# Patient Record
Sex: Male | Born: 1975 | Race: White | Hispanic: No | Marital: Married | Smoking: Former smoker
Health system: Southern US, Community
[De-identification: ages and names within clinical notes are randomized; demographics above are authoritative.]

## PROBLEM LIST (undated history)

## (undated) DIAGNOSIS — M549 Dorsalgia, unspecified: Secondary | ICD-10-CM

## (undated) DIAGNOSIS — E785 Hyperlipidemia, unspecified: Secondary | ICD-10-CM

## (undated) DIAGNOSIS — Z9889 Other specified postprocedural states: Secondary | ICD-10-CM

## (undated) DIAGNOSIS — I499 Cardiac arrhythmia, unspecified: Secondary | ICD-10-CM

## (undated) DIAGNOSIS — Z87828 Personal history of other (healed) physical injury and trauma: Secondary | ICD-10-CM

## (undated) DIAGNOSIS — J302 Other seasonal allergic rhinitis: Secondary | ICD-10-CM

## (undated) DIAGNOSIS — G8929 Other chronic pain: Secondary | ICD-10-CM

## (undated) DIAGNOSIS — I10 Essential (primary) hypertension: Secondary | ICD-10-CM

## (undated) DIAGNOSIS — Z87442 Personal history of urinary calculi: Secondary | ICD-10-CM

## (undated) DIAGNOSIS — R112 Nausea with vomiting, unspecified: Secondary | ICD-10-CM

## (undated) DIAGNOSIS — J392 Other diseases of pharynx: Secondary | ICD-10-CM

## (undated) DIAGNOSIS — K219 Gastro-esophageal reflux disease without esophagitis: Secondary | ICD-10-CM

## (undated) DIAGNOSIS — Z8619 Personal history of other infectious and parasitic diseases: Secondary | ICD-10-CM

## (undated) HISTORY — PX: STRABISMUS SURGERY: SHX218

## (undated) HISTORY — PX: ADENOIDECTOMY: SUR15

## (undated) HISTORY — PX: EYE SURGERY: SHX253

## (undated) HISTORY — DX: Hyperlipidemia, unspecified: E78.5

## (undated) HISTORY — DX: Other diseases of pharynx: J39.2

## (undated) HISTORY — DX: Gastro-esophageal reflux disease without esophagitis: K21.9

---

## 1999-03-25 ENCOUNTER — Emergency Department (HOSPITAL_COMMUNITY): Admission: EM | Admit: 1999-03-25 | Discharge: 1999-03-25 | Payer: Self-pay | Admitting: Emergency Medicine

## 2004-05-10 ENCOUNTER — Ambulatory Visit: Payer: Self-pay | Admitting: Internal Medicine

## 2004-08-10 ENCOUNTER — Emergency Department (HOSPITAL_COMMUNITY): Admission: AD | Admit: 2004-08-10 | Discharge: 2004-08-10 | Payer: Self-pay | Admitting: Family Medicine

## 2004-09-07 ENCOUNTER — Emergency Department (HOSPITAL_COMMUNITY): Admission: EM | Admit: 2004-09-07 | Discharge: 2004-09-07 | Payer: Self-pay | Admitting: Emergency Medicine

## 2004-12-28 ENCOUNTER — Ambulatory Visit: Payer: Self-pay | Admitting: Family Medicine

## 2005-12-25 ENCOUNTER — Ambulatory Visit: Payer: Self-pay | Admitting: Family Medicine

## 2006-02-02 ENCOUNTER — Ambulatory Visit: Payer: Self-pay | Admitting: Family Medicine

## 2006-04-09 ENCOUNTER — Ambulatory Visit: Payer: Self-pay | Admitting: Family Medicine

## 2007-01-01 ENCOUNTER — Ambulatory Visit: Payer: Self-pay | Admitting: Family Medicine

## 2007-01-01 DIAGNOSIS — K219 Gastro-esophageal reflux disease without esophagitis: Secondary | ICD-10-CM

## 2007-01-11 ENCOUNTER — Ambulatory Visit: Payer: Self-pay | Admitting: Family Medicine

## 2007-02-22 ENCOUNTER — Ambulatory Visit: Payer: Self-pay | Admitting: Family Medicine

## 2007-06-24 ENCOUNTER — Telehealth: Payer: Self-pay | Admitting: Family Medicine

## 2007-06-29 ENCOUNTER — Telehealth: Payer: Self-pay | Admitting: Family Medicine

## 2007-07-07 ENCOUNTER — Ambulatory Visit: Payer: Self-pay | Admitting: Family Medicine

## 2007-07-10 ENCOUNTER — Ambulatory Visit: Payer: Self-pay | Admitting: Internal Medicine

## 2007-10-11 ENCOUNTER — Ambulatory Visit: Payer: Self-pay | Admitting: Family Medicine

## 2007-10-13 ENCOUNTER — Telehealth (INDEPENDENT_AMBULATORY_CARE_PROVIDER_SITE_OTHER): Payer: Self-pay | Admitting: Internal Medicine

## 2007-10-14 ENCOUNTER — Telehealth (INDEPENDENT_AMBULATORY_CARE_PROVIDER_SITE_OTHER): Payer: Self-pay | Admitting: Internal Medicine

## 2008-01-02 ENCOUNTER — Emergency Department (HOSPITAL_COMMUNITY): Admission: EM | Admit: 2008-01-02 | Discharge: 2008-01-02 | Payer: Self-pay | Admitting: Family Medicine

## 2008-01-07 ENCOUNTER — Ambulatory Visit: Payer: Self-pay | Admitting: Family Medicine

## 2008-02-14 ENCOUNTER — Ambulatory Visit: Payer: Self-pay | Admitting: Family Medicine

## 2008-02-14 DIAGNOSIS — E785 Hyperlipidemia, unspecified: Secondary | ICD-10-CM | POA: Insufficient documentation

## 2008-02-16 LAB — CONVERTED CEMR LAB
AST: 26 units/L (ref 0–37)
Alkaline Phosphatase: 55 units/L (ref 39–117)
BUN: 6 mg/dL (ref 6–23)
Cholesterol: 211 mg/dL (ref 0–200)
Creatinine, Ser: 0.9 mg/dL (ref 0.4–1.5)
Direct LDL: 135.2 mg/dL
Glucose, Bld: 73 mg/dL (ref 70–99)
HCT: 44.8 % (ref 39.0–52.0)
Lymphocytes Relative: 32.7 % (ref 12.0–46.0)
MCHC: 35.1 g/dL (ref 30.0–36.0)
MCV: 88 fL (ref 78.0–100.0)
Monocytes Absolute: 0.4 10*3/uL (ref 0.1–1.0)
Monocytes Relative: 7.3 % (ref 3.0–12.0)
Neutro Abs: 3.2 10*3/uL (ref 1.4–7.7)
Neutrophils Relative %: 57.5 % (ref 43.0–77.0)
Platelets: 184 10*3/uL (ref 150–400)
Potassium: 4.5 meq/L (ref 3.5–5.1)
RBC: 5.09 M/uL (ref 4.22–5.81)
Sodium: 143 meq/L (ref 135–145)
TSH: 1.98 microintl units/mL (ref 0.35–5.50)
Total Bilirubin: 0.8 mg/dL (ref 0.3–1.2)
Total Protein: 6.9 g/dL (ref 6.0–8.3)
VLDL: 33 mg/dL (ref 0–40)
WBC: 5.5 10*3/uL (ref 4.5–10.5)

## 2008-03-01 ENCOUNTER — Telehealth (INDEPENDENT_AMBULATORY_CARE_PROVIDER_SITE_OTHER): Payer: Self-pay | Admitting: *Deleted

## 2008-05-30 ENCOUNTER — Ambulatory Visit: Payer: Self-pay | Admitting: Family Medicine

## 2008-05-31 LAB — CONVERTED CEMR LAB
AST: 21 units/L (ref 0–37)
Cholesterol: 210 mg/dL (ref 0–200)
HDL: 35 mg/dL — ABNORMAL LOW (ref >39.0)
Triglycerides: 76 mg/dL (ref 0–149)

## 2008-08-17 ENCOUNTER — Ambulatory Visit: Payer: Self-pay | Admitting: Family Medicine

## 2008-08-17 DIAGNOSIS — J309 Allergic rhinitis, unspecified: Secondary | ICD-10-CM | POA: Insufficient documentation

## 2008-08-17 DIAGNOSIS — G5600 Carpal tunnel syndrome, unspecified upper limb: Secondary | ICD-10-CM | POA: Insufficient documentation

## 2008-09-28 ENCOUNTER — Ambulatory Visit: Payer: Self-pay | Admitting: Family Medicine

## 2008-10-03 LAB — CONVERTED CEMR LAB
ALT: 25 units/L (ref 0–53)
AST: 20 units/L (ref 0–37)
Cholesterol: 180 mg/dL (ref 0–200)
HDL: 36.6 mg/dL — ABNORMAL LOW (ref >39.00)

## 2008-11-08 ENCOUNTER — Ambulatory Visit: Payer: Self-pay | Admitting: Family Medicine

## 2008-11-08 DIAGNOSIS — M79609 Pain in unspecified limb: Secondary | ICD-10-CM

## 2008-11-08 DIAGNOSIS — S8010XA Contusion of unspecified lower leg, initial encounter: Secondary | ICD-10-CM

## 2008-11-10 ENCOUNTER — Encounter: Payer: Self-pay | Admitting: Family Medicine

## 2009-01-04 ENCOUNTER — Ambulatory Visit: Payer: Self-pay | Admitting: Family Medicine

## 2009-01-09 LAB — CONVERTED CEMR LAB
HDL: 38.7 mg/dL — ABNORMAL LOW (ref >39.00)
LDL Cholesterol: 101 mg/dL — ABNORMAL HIGH (ref 0–99)
Triglycerides: 123 mg/dL (ref 0.0–149.0)

## 2009-05-19 ENCOUNTER — Emergency Department (HOSPITAL_COMMUNITY): Admission: EM | Admit: 2009-05-19 | Discharge: 2009-05-19 | Payer: Self-pay | Admitting: Emergency Medicine

## 2009-05-28 ENCOUNTER — Ambulatory Visit: Payer: Self-pay | Admitting: Family Medicine

## 2009-05-28 DIAGNOSIS — M542 Cervicalgia: Secondary | ICD-10-CM | POA: Insufficient documentation

## 2009-05-28 DIAGNOSIS — S069X9A Unspecified intracranial injury with loss of consciousness of unspecified duration, initial encounter: Secondary | ICD-10-CM

## 2009-05-28 DIAGNOSIS — S060X0A Concussion without loss of consciousness, initial encounter: Secondary | ICD-10-CM

## 2009-06-21 ENCOUNTER — Emergency Department (HOSPITAL_COMMUNITY): Admission: EM | Admit: 2009-06-21 | Discharge: 2009-06-21 | Payer: Self-pay | Admitting: Emergency Medicine

## 2009-06-22 ENCOUNTER — Encounter: Payer: Self-pay | Admitting: Family Medicine

## 2009-07-10 ENCOUNTER — Encounter: Payer: Self-pay | Admitting: Family Medicine

## 2009-12-28 ENCOUNTER — Emergency Department (HOSPITAL_COMMUNITY): Admission: EM | Admit: 2009-12-28 | Discharge: 2009-12-28 | Payer: Self-pay | Admitting: Emergency Medicine

## 2009-12-28 ENCOUNTER — Encounter: Payer: Self-pay | Admitting: Family Medicine

## 2010-01-18 ENCOUNTER — Ambulatory Visit: Payer: Self-pay | Admitting: Family Medicine

## 2010-01-18 ENCOUNTER — Encounter: Payer: Self-pay | Admitting: Family Medicine

## 2010-01-18 DIAGNOSIS — J189 Pneumonia, unspecified organism: Secondary | ICD-10-CM | POA: Insufficient documentation

## 2010-05-21 NOTE — Assessment & Plan Note (Signed)
Summary: F/U [place] ON 05/19/09/CLE   Vital Signs:  Patient profile:   35 year old male Weight:      215.25 pounds BMI:     31.44 Temp:     98.2 degrees F oral Pulse rate:   64 / minute Pulse rhythm:   regular BP sitting:   90 / 70  (left arm) Cuff size:   large  Vitals Entered By: Linde Gillis CMA Duncan Dull) (May 28, 2009 10:01 AM) CC: f/u [place]   History of Present Illness: 35 year old male:  f/u lac, neck strain from ER  ER notes reviewed  cuttin ga tree away from another tree and there was one that was pinned, laid the saw down and was hit in the top of the head. he did see stars for a few seconds, and had some minimal amnesia, however he had a prominent covering has had no symptoms after the incident.  not sure about glasses and hat coming off   Neck still hurting a lot. and having some discomfort from coughing. he has no limitation on his motion and having some mild posterior muscular pain and some trap pain.  additionally, he had 7 staples in his head, and he is removed today.  Staple removal  Allergies (verified): No Known Drug Allergies  Past History:  Past medical, surgical, family and social histories (including risk factors) reviewed, and no changes noted (except as noted below).  Past Medical History: Reviewed history from 08/17/2008 and no changes required. strong gag reflex gerd rhinitis hyperlipidemia carpal tunnel  Past Surgical History: Reviewed history from 02/14/2008 and no changes required. Eye surgery- strabismus Severe ankle sprain (2006) mono- 09  Family History: Reviewed history from 02/14/2008 and no changes required. Father: Parkinson's, CAD, MI x 2, DM Mother: high cholesterol - diet controlled  Siblings:   Social History: Reviewed history from 07/10/2007 and no changes required. Marital Status: Married Children: 3 Occupation: [employer] quit smoking x 3 years  Review of Systems       REVIEW OF SYSTEMS  GEN: No  systemic complaints, no fevers, chills, sweats, or other acute illnesses MSK: Detailed in the HPI GI: tolerating PO intake without difficulty Neuro: No numbness, parasthesias, or tingling associated. Otherwise the pertinent positives of the ROS are noted above.    Physical Exam  General:  Well-developed,well-nourished,in no acute distress; alert,appropriate and cooperative throughout examination Head:  incision c/d/i Ears:  no external deformities.   Nose:  no external deformity.   Lungs:  Normal respiratory effort, chest expands symmetrically. Lungs are clear to auscultation, no crackles or wheezes. Heart:  Normal rate and regular rhythm. S1 and S2 normal without gallop, murmur, click, rub or other extra sounds.   Detailed Back/Spine Exam  Cervical Exam:  Inspection-deformity:    Normal Palpation-spinal tenderness:  Normal Range of Motion:    Forward Flexion:   60 degrees    Hyperextension:   75 degrees    Right Lat. Flexion:   45 degrees    Left Lat. Flexion:   45 degrees    Right Lat. Rotation:   80 degrees    Left Lat. Rotation:   80 degrees Spurling Maneuver:    negative   Impression & Recommendations:  Problem # 1:  NECK PAIN (ICD-723.1) Assessment New 05/19/2009 doi  neck strain, c/w motrin, heat, massage, ROM  His updated medication list for this problem includes:    Tramadol Hcl 50 Mg Tabs (Tramadol hcl) .Marland Kitchen... Take one to  two tablet by mouth every six hours as needed for pain  Problem # 2:  CONCUSSION WITH NO LOSS OF CONSCIOUSNESS (ICD-850.0) Assessment: New acute simple concussion without symptoms currently  Problem # 3:  HEAD INJURY, NOS (ICD-854.00)  head laceration  removed 7 staples without difficulty, I was not the operating physician  Orders: Suture Removal by Non-Operative MD ([id number])  Complete Medication List: 1)  Multivitamins Tabs (Multiple vitamin) .... One by mouth daily 2)  Ginseng  .... Daily 3)  Omeprazole 20 Mg Cpdr (Omeprazole) .Marland Kitchen..  1 by mouth once daily in am about 30 minutes before breakfast 4)  Allegra 180 Mg Tabs (Fexofenadine hcl) .Marland Kitchen.. 1 by mouth once daily for allergies 5)  Zocor 20 Mg Tabs (Simvastatin) .Marland Kitchen.. 1 by mouth once daily 6)  Tramadol Hcl 50 Mg Tabs (Tramadol hcl) .... Take one to two tablet by mouth every six hours as needed for pain  Current Allergies (reviewed today): No known allergies

## 2010-05-21 NOTE — Letter (Signed)
Summary: [facility] [facility]  [place]   Imported By: Sherian Rein 01/24/2010 15:14:19  _____________________________________________________________________  External Attachment:    Type:   Image     Comment:   External Document

## 2010-05-21 NOTE — Letter (Signed)
Summary: [facility]   Imported By: Maryln Gottron 07/17/2009 10:10:38  _____________________________________________________________________  External Attachment:    Type:   Image     Comment:   External Document

## 2010-05-21 NOTE — Assessment & Plan Note (Signed)
Summary: F/U [place] ON 12/28/09/CLE   Vital Signs:  Patient profile:   35 year old Blevins Height:      69.5 inches Weight:      196.50 pounds BMI:     28.71 Temp:     98.9 degrees F oral Pulse rate:   68 / minute Pulse rhythm:   regular BP sitting:   100 / 70  (left arm) Cuff size:   regular  Vitals Entered By: Lewanda Rife LPN (January 18, 2010 9:23 AM) CC: follow-up visit after ER visit on 12/28/09   History of Present Illness: here for f/u of pneumonia dx by ER on 9/9  tx with zithromax and tussionex- did not keep him there  overnight   still has a lot of pressure in chest -- that is a bit worse with deep breaths some kind of drainage in throat- hoarse all day (still a non smoker)  feels decent but tired , no fever  cannot get thru a full workout  still wheezes - had an inhaler -- has run out  in ams coughs up green d/c -- better through the day    LLL infiltrate on cxr no idea how he got it   prior smoker  wt is down 19 lb from last visit he has been working on diet and exercise to loose   Allergies (verified): 1)  ! Allegra 2)  Claritin  Past History:  Past Medical History: Last updated: 08/17/2008 strong gag reflex gerd rhinitis hyperlipidemia carpal tunnel  Past Surgical History: Last updated: 02/14/2008 Eye surgery- strabismus Severe ankle sprain (2006) mono- 09  Family History: Last updated: 02/14/2008 Father: Parkinson's, CAD, MI x 2, DM Mother: high cholesterol - diet controlled  Siblings:   Social History: Last updated: 07/10/2007 Marital Status: Married Children: 3 Occupation: [employer] quit smoking x 3 years  Risk Factors: Smoking Status: quit (12/30/2006)  Review of Systems General:  Complains of fatigue and malaise; denies chills, fever, and loss of appetite. Eyes:  Denies blurring and discharge. ENT:  Complains of hoarseness, nasal congestion, and postnasal drainage; denies sinus pressure. CV:  Complains of chest pain or  discomfort; denies palpitations, shortness of breath with exertion, and swelling of feet. Resp:  Complains of cough, pleuritic, and sputum productive. GI:  Denies diarrhea, nausea, and vomiting. MS:  Denies joint pain. Derm:  Denies itching and rash. Heme:  Denies abnormal bruising, bleeding, and enlarge lymph nodes.  Physical Exam  General:  overall well appearing - wt loss noted  Head:  normocephalic, atraumatic, and no abnormalities observed.  no sinus tenderness Eyes:  vision grossly intact, pupils equal, pupils round, pupils reactive to light, and no injection.   Nose:  no nasal discharge.   Mouth:  pharynx pink and moist.   Neck:  No deformities, masses, or tenderness noted. Lungs:  CTA with harsh bs at bases  no rhonchi or rales  no wheeze or prolonged exp phase is not sob Heart:  Normal rate and regular rhythm. S1 and S2 normal without gallop, murmur, click, rub or other extra sounds. Abdomen:  soft and non-tender.   Extremities:  No clubbing, cyanosis, edema, or deformity noted with normal full range of motion of all joints.   Neurologic:  gait normal and DTRs symmetrical and normal.   Skin:  Intact without suspicious lesions or rashes Cervical Nodes:  No lymphadenopathy noted Psych:  normal affect, talkative and pleasant    Impression & Recommendations:  Problem # 1:  PNEUMONIA,  LEFT (ICD-486) Assessment New dx and tx with zpack on 9/9 and still having chest symptoms / pleuritic  tx with levaquin and cxr today did review ER records recommend sympt care- see pt instructions   update if worse or sob renewed albuterol mdi  will need to f/u after resolution for pneumovax His updated medication list for this problem includes:    Levaquin 500 Mg Tabs (Levofloxacin) .Marland Kitchen... 1 by mouth once daily for 10 days  Orders: T-2 View CXR ([id number]) Prescription Created Electronically ([phone])  Complete Medication List: 1)  Multivitamins Tabs (Multiple vitamin) .... One by mouth  daily 2)  Ginseng  .... Daily 3)  Omeprazole 20 Mg Cpdr (Omeprazole) .Marland Kitchen.. 1 by mouth once daily in am about 30 minutes before breakfast 4)  Allegra 180 Mg Tabs (Fexofenadine hcl) .Marland Kitchen.. 1 by mouth once daily for allergies 5)  Zocor 20 Mg Tabs (Simvastatin) .Marland Kitchen.. 1 by mouth once daily 6)  Tramadol Hcl 50 Mg Tabs (Tramadol hcl) .... Take one to two tablet by mouth every six hours as needed for pain 7)  Levaquin 500 Mg Tabs (Levofloxacin) .Marland Kitchen.. 1 by mouth once daily for 10 days 8)  Proair Hfa 108 (Billy Base) Mcg/act Aers (Albuterol sulfate) .... 2 puffs up to every 4 hours as needed wheeze  Patient Instructions: 1)  try zyrtec 10 mg daily for allergies since allegra does not work  2)  chest x ray today  3)  you can try mucinex over the counter twice daily as directed and nasal saline spray for congestion 4)  tylenol over the counter as directed may help with aches, headache and fever 5)  call if symptoms worsen or if not improved in 4-5 days  6)  take the levaquin as directed  7)  use inhaler as need  Prescriptions: PROAIR HFA 108 (Billy BASE) MCG/ACT AERS (ALBUTEROL SULFATE) 2 puffs up to every 4 hours as needed wheeze  #1 mdi x 1   Entered and Authorized by:   Judith Part MD   Signed by:   Judith Part MD on 01/18/2010   Method used:   Electronically to        [id number]       [address]       [place], [place]  [address]       Ph: [phone]       Fax: [phone]   RxID:   [id number] LEVAQUIN 500 MG TABS (LEVOFLOXACIN) 1 by mouth once daily for 10 days  #10 x 0   Entered and Authorized by:   Judith Part MD   Signed by:   Judith Part MD on 01/18/2010   Method used:   Electronically to        [facility] [address] [id number]       [address]       [place], [place]  [address]       Ph: [phone]       Fax: [phone]   RxID:   [id number]   Current Allergies (reviewed today): !  ALLEGRA CLARITIN

## 2010-05-21 NOTE — Miscellaneous (Signed)
Summary: [facility]   Imported By: Lester McKinney 01/28/2010 10:55:52  _____________________________________________________________________  External Attachment:    Type:   Image     Comment:   External Document

## 2010-05-21 NOTE — Letter (Signed)
Summary: [facility]  [facility]   Imported By: Lanelle Bal 06/29/2009 13:43:36  _____________________________________________________________________  External Attachment:    Type:   Image     Comment:   External Document

## 2010-07-14 LAB — URINALYSIS, ROUTINE W REFLEX MICROSCOPIC
Bilirubin Urine: NEGATIVE
Ketones, ur: 15 mg/dL — AB
Nitrite: NEGATIVE
Protein, ur: 30 mg/dL — AB
Urobilinogen, UA: 1 mg/dL (ref 0.0–1.0)
pH: 6 (ref 5.0–8.0)

## 2010-07-14 LAB — URINE MICROSCOPIC-ADD ON

## 2010-09-09 ENCOUNTER — Ambulatory Visit (HOSPITAL_BASED_OUTPATIENT_CLINIC_OR_DEPARTMENT_OTHER)
Admission: RE | Admit: 2010-09-09 | Discharge: 2010-09-09 | Disposition: A | Discharge status: Home / Self Care | Payer: BC Managed Care – PPO | Source: Ambulatory Visit | Attending: Otolaryngology | Admitting: Otolaryngology

## 2010-09-09 ENCOUNTER — Other Ambulatory Visit: Payer: Self-pay | Admitting: Otolaryngology

## 2010-09-09 DIAGNOSIS — Z01812 Encounter for preprocedural laboratory examination: Secondary | ICD-10-CM | POA: Insufficient documentation

## 2010-09-09 DIAGNOSIS — J3501 Chronic tonsillitis: Secondary | ICD-10-CM | POA: Insufficient documentation

## 2010-09-09 HISTORY — PX: TONSILLECTOMY: SUR1361

## 2010-09-10 LAB — POCT HEMOGLOBIN-HEMACUE: Hemoglobin: 16.1 g/dL (ref 13.0–17.0)

## 2010-09-18 NOTE — Op Note (Signed)
  NAMEHANAD, LEINO               ACCOUNT NO.:  [id number]  MEDICAL RECORD NO.:  [id number]            PATIENT TYPE:  LOCATION:                                 FACILITY:  PHYSICIAN:  Ameena Vesey H. Pollyann Kennedy, MD          DATE OF BIRTH:  DATE OF PROCEDURE:  09/09/2010 DATE OF DISCHARGE:                              OPERATIVE REPORT   PREOPERATIVE DIAGNOSIS:  Chronic tonsillitis.  POSTOPERATIVE DIAGNOSIS:  Chronic tonsillitis.  PROCEDURE:  Tonsillectomy.  SURGEON:  Megen Madewell H. Pollyann Kennedy, MD  ANESTHESIA:  General endotracheal anesthesia was used.  COMPLICATIONS:  None.  BLOOD LOSS:  50 mL.  FINDINGS:  Moderate-sized tonsils with deep cryptic spaces and large amounts of tonsil lithiasis bilaterally.  REFERRING PHYSICIAN:  Jeanice Lim, MD  HISTORY:  A 35 year old with history of chronic and recurring tonsillar pharyngitis.  The risks, benefits, alternatives, and complications of the procedure were explained to the patient who understand and agreed to surgery.  PROCEDURE:  The patient was taken to the operating room, placed on the operating table in supine position.  Following induction of general endotracheal anesthesia the table was turned and the patient was draped in a standard fashion.  Crowe-Davis mouth gag was inserted into the oral cavity used to retract the tongue and mandible attached to the Mayo stand.  Red rubber catheter was inserted through right side of nose, withdrawn through the mouth and used to retract the soft palate and uvula.  Tonsillectomy was performed using electrocautery dissection carefully dissecting avascular plane between the capsule and constrictor muscles.  Suction cautery was used for completion of hemostasis.  The tonsils were sent together for pathologic evaluation. The pharynx was irrigated with saline and suction.  Orogastric tube was used to aspirate the contents of the stomach.  The patient was awakened, extubated and transferred to recovery in  stable condition.     Chyan Carnero H. Pollyann Kennedy, MD     JHR/MEDQ  D:  09/09/2010  T:  09/09/2010  Job:  [id number]  cc:   Jeanice Lim, M.D.  Electronically Signed by Serena Colonel MD on 09/18/2010 07:47:38 AM

## 2010-12-02 ENCOUNTER — Other Ambulatory Visit: Payer: Self-pay | Admitting: *Deleted

## 2010-12-02 MED ORDER — OMEPRAZOLE 20 MG PO TBEC: DELAYED_RELEASE_TABLET | ORAL | Status: DC

## 2011-01-22 LAB — DIFFERENTIAL
Basophils Relative: 0
Eosinophils Relative: 0
Lymphs Abs: 1.7
Monocytes Relative: 13 — ABNORMAL HIGH
Neutrophils Relative %: 70

## 2011-01-22 LAB — CBC
HCT: 40.4
Hemoglobin: 14
MCHC: 34.7
RBC: 4.7
RDW: 13.2
WBC: 10.3

## 2011-01-22 LAB — POCT INFECTIOUS MONO SCREEN: Mono Screen: POSITIVE — AB

## 2011-03-31 ENCOUNTER — Encounter: Payer: Self-pay | Admitting: Family Medicine

## 2011-04-01 ENCOUNTER — Ambulatory Visit (INDEPENDENT_AMBULATORY_CARE_PROVIDER_SITE_OTHER): Payer: BC Managed Care – PPO | Admitting: Family Medicine

## 2011-04-01 ENCOUNTER — Encounter: Payer: Self-pay | Admitting: Family Medicine

## 2011-04-01 VITALS — BP 116/74 | HR 88 | Temp 98.5°F | Ht 69.5 in | Wt 220.8 lb

## 2011-04-01 DIAGNOSIS — R309 Painful micturition, unspecified: Secondary | ICD-10-CM

## 2011-04-01 DIAGNOSIS — N451 Epididymitis: Secondary | ICD-10-CM

## 2011-04-01 DIAGNOSIS — N453 Epididymo-orchitis: Secondary | ICD-10-CM

## 2011-04-01 DIAGNOSIS — R300 Dysuria: Secondary | ICD-10-CM

## 2011-04-01 LAB — POCT UA - MICROSCOPIC ONLY
Bacteria, U Microscopic: 0
Casts, Ur, LPF, POC: 0
RBC, urine, microscopic: 0

## 2011-04-01 LAB — POCT URINALYSIS DIPSTICK
Bilirubin, UA: NEGATIVE
Glucose, UA: NEGATIVE
Ketones, UA: NEGATIVE
Leukocytes, UA: NEGATIVE
Nitrite, UA: NEGATIVE
Protein, UA: NEGATIVE
Spec Grav, UA: 1.015
Urobilinogen, UA: 0.2

## 2011-04-01 MED ORDER — CIPROFLOXACIN HCL 250 MG PO TABS: 250.0000 mg | ORAL_TABLET | Freq: Two times a day (BID) | ORAL | Status: AC

## 2011-04-01 MED ORDER — SIMVASTATIN 20 MG PO TABS: 20.0000 mg | ORAL_TABLET | Freq: Every day | ORAL | Status: DC

## 2011-04-01 NOTE — Progress Notes (Signed)
Subjective:    Patient ID: Billy Blevins, male    DOB: 1975/09/15, 35 y.o.   MRN: [id number]  HPI Here for mass on L testicle and also   Had a little dysuria this am  Drinks a lot of fluids- not dehydrated  No fever  Feels just a little off today  No std exposure suspect at all No discharge   Above his L testicle has a soft protuberant area - a little tender Has noticed for past month or so  Also L testicle is higher than it used to be  Also just a little tender  No pain on staining or bulge   Never had problems as a child No skin changes   ua is negative today   Patient Active Problem List  Diagnoses  . HYPERLIPIDEMIA  . CARPAL TUNNEL SYNDROME  . RHINITIS  . PNEUMONIA, LEFT  . G E R D  . NECK PAIN  . LEG PAIN, RIGHT  . CONCUSSION WITH NO LOSS OF CONSCIOUSNESS  . HEAD INJURY, NOS  . CONTUSION, LOWER LEG, RIGHT  . Epididymitis   Past Medical History  Diagnosis Date  . Hyperactive gag reflex   . GERD (gastroesophageal reflux disease)   . Allergy     rhinitis  . Hyperlipidemia   . Mononucleosis 2009  . Severe ankle sprain 2006   Past Surgical History  Procedure Date  . Eye surgery     strabismus   History  Substance Use Topics  . Smoking status: Former Smoker    Quit date: 04/22/2007  . Smokeless tobacco: Not on file  . Alcohol Use:    Family History  Problem Relation Age of Onset  . Hyperlipidemia Mother     diet controlled  . Heart disease Father     CAD/ MI x 2  . Diabetes Father   . Parkinsonism Father    Allergies  Allergen Reactions  . Fexofenadine     REACTION: not effective  . Loratadine     REACTION: not effective   Current Outpatient Prescriptions on File Prior to Visit  Medication Sig Dispense Refill  . fexofenadine (ALLEGRA) 180 MG tablet Take 180 mg by mouth daily.        . Multiple Vitamin (MULTIVITAMIN) tablet Take 1 tablet by mouth daily.        . Omeprazole 20 MG TBEC Take one by mouth once daily in the morning,about 30  minutes before breakfast.  90 each  0  . albuterol (PROAIR HFA) 108 (90 BASE) MCG/ACT inhaler Inhale 2 puffs into the lungs every 4 (four) hours as needed.           Review of Systems Review of Systems  Constitutional: Negative for fever, appetite change, fatigue and unexpected weight change.  Eyes: Negative for pain and visual disturbance.  Respiratory: Negative for cough and shortness of breath.   Cardiovascular: Negative for cp or palpitations    Gastrointestinal: Negative for nausea, diarrhea and constipation.  Genitourinary: Negative for urgency and frequency. pos for one episode of dysuria this am, no hematuria  Skin: Negative for pallor or rash   Neurological: Negative for weakness, light-headedness, numbness and headaches.  Hematological: Negative for adenopathy. Does not bruise/bleed easily.  Psychiatric/Behavioral: Negative for dysphoric mood. The patient is not nervous/anxious.          Objective:   Physical Exam  Constitutional: He appears well-developed and well-nourished. No distress.  HENT:  Head: Normocephalic and atraumatic.  Mouth/Throat: Oropharynx is clear  and moist.  Eyes: Conjunctivae and EOM are normal. Pupils are equal, round, and reactive to light. No scleral icterus.  Neck: Normal range of motion. Neck supple. No JVD present. No thyromegaly present.  Abdominal: He exhibits no abdominal bruit. There is no tenderness. No hernia. Hernia confirmed negative in the ventral area, confirmed negative in the right inguinal area and confirmed negative in the left inguinal area.  Genitourinary: Penis normal. Cremasteric reflex is present. Right testis shows no swelling. Left testis shows no swelling and no tenderness. No penile erythema or penile tenderness. No discharge found.       Soft area of fullness just above L testicle that is mildly tender - consistent with epididymitis  No varicocele noted   Musculoskeletal: He exhibits no edema.  Lymphadenopathy:    He has  no cervical adenopathy.       Right: No inguinal adenopathy present.       Left: No inguinal adenopathy present.  Neurological: He is alert. He has normal reflexes.  Skin: Skin is warm and dry. No rash noted. No erythema. No pallor.  Psychiatric: He has a normal mood and affect.          Assessment & Plan:

## 2011-04-01 NOTE — Patient Instructions (Signed)
Take the cipro as directed  Warm sitz baths may help  Over the counter pain medicine is ok  If worse or other sympotms like fever- alert me  If not improved after finishing cipro- alert me

## 2011-04-01 NOTE — Assessment & Plan Note (Addendum)
With not exp to STD- disc poss of inflammatory vs infectious  Pt denies any exp to std whatsoever Disc poss etiologies of this condition  Will tx with cipro bid and also sympt care (Disc symptomatic care - see instructions on AVS )- and also handout If worse or no imp - will send for [place] of testicle inst to call asap if worse pain or any swelling

## 2011-04-16 ENCOUNTER — Telehealth: Payer: Self-pay | Admitting: Internal Medicine

## 2011-04-16 DIAGNOSIS — N5089 Other specified disorders of the male genital organs: Secondary | ICD-10-CM

## 2011-04-16 NOTE — Telephone Encounter (Signed)
Patient would like to be setup for a Ultrasound he was here for an appointment on 12.11.12 for painful urination and left testicular mass.  He stated the painful urination has gone away but the mass is still there.  Please advise.

## 2011-04-16 NOTE — Telephone Encounter (Signed)
Order done

## 2011-04-17 ENCOUNTER — Other Ambulatory Visit: Payer: BC Managed Care – PPO

## 2011-04-23 ENCOUNTER — Ambulatory Visit
Admission: RE | Admit: 2011-04-23 | Discharge: 2011-04-23 | Disposition: A | Discharge status: Home / Self Care | Payer: BC Managed Care – PPO | Source: Ambulatory Visit | Attending: Family Medicine | Admitting: Family Medicine

## 2011-04-23 ENCOUNTER — Other Ambulatory Visit: Payer: Self-pay | Admitting: Family Medicine

## 2011-04-23 DIAGNOSIS — N5089 Other specified disorders of the male genital organs: Secondary | ICD-10-CM

## 2011-04-25 ENCOUNTER — Telehealth: Payer: Self-pay | Admitting: Family Medicine

## 2011-04-25 DIAGNOSIS — N5089 Other specified disorders of the male genital organs: Secondary | ICD-10-CM

## 2011-04-25 NOTE — Telephone Encounter (Signed)
Will go ahead and do urol ref

## 2011-04-25 NOTE — Telephone Encounter (Signed)
Message copied by Judy Pimple on Fri Apr 25, 2011  8:47 AM ------      Message from: Patience Musca      Created: Thu Apr 24, 2011  5:45 PM       Patient notified as instructed by telephone. Pt is still having some discomfort and would like urological referral. Pt will wait to hear from pt care coordinator.

## 2011-04-26 ENCOUNTER — Other Ambulatory Visit: Payer: Self-pay | Admitting: Family Medicine

## 2011-05-22 ENCOUNTER — Telehealth: Payer: Self-pay | Admitting: Family Medicine

## 2011-05-22 NOTE — Telephone Encounter (Signed)
Will see him then 

## 2011-05-22 NOTE — Telephone Encounter (Signed)
Patient called to let you know you'll be receiving a pre-op clearance form from Dr.Brooks,[facility],for back surgery.  I explained to him you haven't seen him for a physical since 2009 and he would need to schedule an appointment.  Patient made appointment for 05/28/11 @ 3:15.

## 2011-05-28 ENCOUNTER — Encounter: Payer: Self-pay | Admitting: Family Medicine

## 2011-05-28 ENCOUNTER — Ambulatory Visit (INDEPENDENT_AMBULATORY_CARE_PROVIDER_SITE_OTHER): Payer: BC Managed Care – PPO | Admitting: Family Medicine

## 2011-05-28 VITALS — BP 110/74 | HR 96 | Temp 98.5°F | Ht 69.5 in | Wt 217.5 lb

## 2011-05-28 DIAGNOSIS — Z01818 Encounter for other preprocedural examination: Secondary | ICD-10-CM | POA: Insufficient documentation

## 2011-05-28 DIAGNOSIS — M5136 Other intervertebral disc degeneration, lumbar region: Secondary | ICD-10-CM | POA: Insufficient documentation

## 2011-05-28 DIAGNOSIS — R000 Tachycardia, unspecified: Secondary | ICD-10-CM

## 2011-05-28 DIAGNOSIS — E785 Hyperlipidemia, unspecified: Secondary | ICD-10-CM

## 2011-05-28 DIAGNOSIS — M5137 Other intervertebral disc degeneration, lumbosacral region: Secondary | ICD-10-CM

## 2011-05-28 NOTE — Progress Notes (Signed)
Subjective:    Patient ID: Billy Blevins, male    DOB: 1975/10/18, 36 y.o.   MRN: [id number]  HPI Here for preoperative exam for back surgery Planning L5- S1 disectomy of the L side at [facility] Does not have surgery date yet  Has deg disk and fragment needs to be removed Will be Dr Shon Baton   Med allergies-none  No hx of anaphalaxis   Anesthesia-no problems No nausea   Prev surgeries-- had tonsillectomy in April  Laser eye surgery in summer - went great   meds - no blood thinners     Chemistry      Component Value Date/Time   NA 143 02/14/2008 1106   K 4.5 02/14/2008 1106   CL 106 02/14/2008 1106   CO2 31 02/14/2008 1106   BUN 6 02/14/2008 1106   CREATININE 0.9 02/14/2008 1106      Component Value Date/Time   CALCIUM 9.1 02/14/2008 1106   ALKPHOS 55 02/14/2008 1106   AST 24 01/04/2009 1039   ALT 37 01/04/2009 1039   BILITOT 0.8 02/14/2008 1106      Lab Results  Component Value Date   WBC 5.5 02/14/2008   HGB 16.1 09/09/2010   HCT 44.8 02/14/2008   MCV 88.0 02/14/2008   PLT 184 02/14/2008    Lab Results  Component Value Date   CHOL 164 01/04/2009   HDL 38.70* 01/04/2009   LDLCALC 101* 01/04/2009   LDLDIRECT 156.0 05/30/2008   TRIG 123.0 01/04/2009   CHOLHDL 4 01/04/2009    Prior smoker- quit smoking 7 years ago No chronic cough or sob  No wheeze or asthma   EKG today sinus rhythm with rate of 95  Did just drink a pepsi   Has not set up labs at hospital   Patient Active Problem List  Diagnoses  . HYPERLIPIDEMIA  . CARPAL TUNNEL SYNDROME  . RHINITIS  . PNEUMONIA, LEFT  . G E R D  . NECK PAIN  . LEG PAIN, RIGHT  . CONCUSSION WITH NO LOSS OF CONSCIOUSNESS  . HEAD INJURY, NOS  . CONTUSION, LOWER LEG, RIGHT  . Epididymitis  . Lump in the testicle  . Preoperative examination  . Lumbar degenerative disc disease  . Tachycardia   Past Medical History  Diagnosis Date  . Hyperactive gag reflex   . GERD (gastroesophageal reflux disease)   .  Allergy     rhinitis  . Hyperlipidemia   . Mononucleosis 2009  . Severe ankle sprain 2006   Past Surgical History  Procedure Date  . Eye surgery     strabismus   History  Substance Use Topics  . Smoking status: Former Smoker    Quit date: 04/22/2007  . Smokeless tobacco: Not on file  . Alcohol Use:    Family History  Problem Relation Age of Onset  . Hyperlipidemia Mother     diet controlled  . Heart disease Father     CAD/ MI x 2  . Diabetes Father   . Parkinsonism Father    Allergies  Allergen Reactions  . Fexofenadine     REACTION: not effective  . Loratadine     REACTION: not effective   Current Outpatient Prescriptions on File Prior to Visit  Medication Sig Dispense Refill  . fexofenadine (ALLEGRA) 180 MG tablet Take 180 mg by mouth daily.        . Multiple Vitamin (MULTIVITAMIN) tablet Take 1 tablet by mouth daily.        Marland Kitchen  omeprazole (PRILOSEC) 20 MG capsule TAKE ONE BY MOUTH ONCE DAILY IN THE MORNING,ABOUT 30 MINUTES BEFORE BREAKFAST.  90 capsule  3  . simvastatin (ZOCOR) 20 MG tablet Take 1 tablet (20 mg total) by mouth at bedtime.  90 tablet  3  . albuterol (PROAIR HFA) 108 (90 BASE) MCG/ACT inhaler Inhale 2 puffs into the lungs every 4 (four) hours as needed.           Review of Systems Review of Systems  Constitutional: Negative for fever, appetite change, fatigue and unexpected weight change.  Eyes: Negative for pain and visual disturbance.  Respiratory: Negative for cough and shortness of breath.   Cardiovascular: Negative for cp or palpitations    Gastrointestinal: Negative for nausea, diarrhea and constipation.  Genitourinary: Negative for urgency and frequency.  Skin: Negative for pallor or rash   MSK pos for back pain , neg for joint swelling or redness Neurological: Negative for weakness, light-headedness, numbness and headaches.  Hematological: Negative for adenopathy. Does not bruise/bleed easily.  Psychiatric/Behavioral: Negative for  dysphoric mood. The patient is not nervous/anxious.          Objective:   Physical Exam  Constitutional: He appears well-developed and well-nourished. No distress.       overwt and well appearing   HENT:  Head: Normocephalic and atraumatic.  Right Ear: External ear normal.  Left Ear: External ear normal.  Nose: Nose normal.  Mouth/Throat: Oropharynx is clear and moist.  Eyes: Conjunctivae and EOM are normal. Pupils are equal, round, and reactive to light. No scleral icterus.  Neck: Normal range of motion. Neck supple. No JVD present. Carotid bruit is not present. No tracheal deviation present. No thyromegaly present.  Cardiovascular: Regular rhythm, normal heart sounds and intact distal pulses.  Exam reveals no gallop.   No murmur heard.      Mildly tachycardic   Pulmonary/Chest: Breath sounds normal. No respiratory distress. He has no wheezes. He exhibits no tenderness.  Abdominal: Soft. Bowel sounds are normal. He exhibits no distension and no mass. There is no tenderness.  Musculoskeletal: He exhibits tenderness. He exhibits no edema.       LS tenderness  Lymphadenopathy:    He has no cervical adenopathy.  Neurological: He is alert. He has normal reflexes. No cranial nerve deficit. He exhibits normal muscle tone. Coordination normal.  Skin: Skin is warm and dry. No rash noted. No erythema. No pallor.  Psychiatric: He has a normal mood and affect.          Assessment & Plan:

## 2011-05-28 NOTE — Patient Instructions (Signed)
Labs today  Your pulse is a bit fast -- and we need to keep an eye on that  Stay away from caffeine  Will update you when labs return  If everything looks ok you are cleared for surgery

## 2011-05-28 NOTE — Assessment & Plan Note (Signed)
Nl exam - except for mildly high pulse at 95 Just drank pepsi- that could be it Lab today Will likley re check this and then clear for surgery Reassuring EKG

## 2011-05-28 NOTE — Assessment & Plan Note (Signed)
Overdue for labs On zocor and diet  Lab today and update Rev low sat fat diet

## 2011-05-28 NOTE — Assessment & Plan Note (Signed)
For upcoming disectomy - pt is doing well with pain med at this time

## 2011-05-28 NOTE — Assessment & Plan Note (Signed)
This is new Suspect from caffeine- pt will refrain from that  Lab today incl cbc and gluc and tsh

## 2011-05-29 LAB — COMPREHENSIVE METABOLIC PANEL
ALT: 56 U/L — ABNORMAL HIGH (ref 0–53)
AST: 30 U/L (ref 0–37)
Alkaline Phosphatase: 44 U/L (ref 39–117)
Creatinine, Ser: 1.7 mg/dL — ABNORMAL HIGH (ref 0.4–1.5)
Total Bilirubin: 0.6 mg/dL (ref 0.3–1.2)

## 2011-05-29 LAB — LIPID PANEL
HDL: 43 mg/dL (ref >39.00)
Total CHOL/HDL Ratio: 4
Triglycerides: 216 mg/dL — ABNORMAL HIGH (ref 0.0–149.0)

## 2011-05-29 LAB — CBC WITH DIFFERENTIAL/PLATELET
Basophils Absolute: 0 10*3/uL (ref 0.0–0.1)
Eosinophils Absolute: 0.2 10*3/uL (ref 0.0–0.7)
Hemoglobin: 14.9 g/dL (ref 13.0–17.0)
Lymphocytes Relative: 39.2 % (ref 12.0–46.0)
Lymphs Abs: 2.6 10*3/uL (ref 0.7–4.0)
MCHC: 35.2 g/dL (ref 30.0–36.0)
Neutro Abs: 3.4 10*3/uL (ref 1.4–7.7)
RDW: 13 % (ref 11.5–14.6)

## 2011-05-29 LAB — LDL CHOLESTEROL, DIRECT: Direct LDL: 97 mg/dL

## 2011-06-05 ENCOUNTER — Telehealth: Payer: Self-pay | Admitting: *Deleted

## 2011-06-05 MED ORDER — TRAMADOL HCL 50 MG PO TABS: 50.0000 mg | ORAL_TABLET | Freq: Three times a day (TID) | ORAL | Status: AC | PRN

## 2011-06-05 NOTE — Telephone Encounter (Signed)
Lets try tramadol - use with caution , will sedate  Will see him soon at his follow up Please call in or elect send px

## 2011-06-05 NOTE — Telephone Encounter (Signed)
Patient notified as instructed by telephone. Medication phoned to  [facility] as instructed.  

## 2011-06-05 NOTE — Telephone Encounter (Signed)
Patient calling asking for something for pain? He states his back is worse and because of his lab results he was told to stop all pain meds? Per pt what can he take?

## 2011-06-10 ENCOUNTER — Encounter: Payer: Self-pay | Admitting: Family Medicine

## 2011-06-10 ENCOUNTER — Ambulatory Visit (INDEPENDENT_AMBULATORY_CARE_PROVIDER_SITE_OTHER): Payer: BC Managed Care – PPO | Admitting: Family Medicine

## 2011-06-10 VITALS — BP 110/68 | HR 100 | Temp 98.2°F | Ht 69.5 in | Wt 218.0 lb

## 2011-06-10 DIAGNOSIS — N289 Disorder of kidney and ureter, unspecified: Secondary | ICD-10-CM | POA: Insufficient documentation

## 2011-06-10 DIAGNOSIS — R7989 Other specified abnormal findings of blood chemistry: Secondary | ICD-10-CM

## 2011-06-10 DIAGNOSIS — R000 Tachycardia, unspecified: Secondary | ICD-10-CM

## 2011-06-10 DIAGNOSIS — R799 Abnormal finding of blood chemistry, unspecified: Secondary | ICD-10-CM

## 2011-06-10 LAB — POCT URINALYSIS DIPSTICK
Blood, UA: NEGATIVE
Glucose, UA: NEGATIVE
Nitrite, UA: NEGATIVE
Protein, UA: NEGATIVE
Urobilinogen, UA: 0.2

## 2011-06-10 LAB — RENAL FUNCTION PANEL
Albumin: 4.4 g/dL (ref 3.5–5.2)
Creatinine, Ser: 1.1 mg/dL (ref 0.4–1.5)
Glucose, Bld: 86 mg/dL (ref 70–99)
Phosphorus: 3.6 mg/dL (ref 2.3–4.6)
Potassium: 4.4 mEq/L (ref 3.5–5.1)
Sodium: 141 mEq/L (ref 135–145)

## 2011-06-10 LAB — POCT UA - MICROSCOPIC ONLY
Casts, Ur, LPF, POC: 0
Yeast, UA: 0

## 2011-06-10 LAB — HEPATIC FUNCTION PANEL
ALT: 41 U/L (ref 0–53)
AST: 24 U/L (ref 0–37)
Albumin: 4.4 g/dL (ref 3.5–5.2)
Alkaline Phosphatase: 49 U/L (ref 39–117)

## 2011-06-10 NOTE — Assessment & Plan Note (Signed)
New in past 2 mo - no etiology and rarely if ever symptomatic Pt is overwt Hx of high chol on statin  Needs cardiac clearance for back surgery asap - so ref to cardiol for eval Rev lab today Nl cbc and tsh

## 2011-06-10 NOTE — Assessment & Plan Note (Signed)
I suspect this was from use of narcotic apap med - he has stopped this and is using tramadol for pain as needed Very rare alcohol No hx of liver problems Re check today

## 2011-06-10 NOTE — Progress Notes (Signed)
Subjective:    Patient ID: Billy Blevins, male    DOB: 11/04/1975, 36 y.o.   MRN: [id number]  HPI Here for f/u of tachycardia , preop exam/ and new elevated cr and ALT  ALT is 56- could be from tylenol in pain med Stopped his pain med  Tramadol is helping   Cr 1.7- was on nsaids inst to drink more water ua neg today  He was on cipro in dec for epididimitis-- no longer has pain  Was taking nsaids - both advil and aleve , and also ibuprofen in a sinus med different times  Is no longer taking that  Is drinking a lot more water now  Is avoiding sodas now and caffeine  Has had 2 glasses of wine/ and some grape juice     Last visit tachycardic-still the case  Does not feel like heart is racing right now  occ feels palpitation / not exertion Pulse at home has been staying 89 or higher --in mid 90s usually    Wt is stable with bmi of 31   Patient Active Problem List  Diagnoses  . HYPERLIPIDEMIA  . CARPAL TUNNEL SYNDROME  . RHINITIS  . PNEUMONIA, LEFT  . G E R D  . NECK PAIN  . LEG PAIN, RIGHT  . CONCUSSION WITH NO LOSS OF CONSCIOUSNESS  . HEAD INJURY, NOS  . CONTUSION, LOWER LEG, RIGHT  . Epididymitis  . Lump in the testicle  . Preoperative examination  . Lumbar degenerative disc disease  . Tachycardia  . Renal insufficiency  . Elevated ALT measurement   Past Medical History  Diagnosis Date  . Hyperactive gag reflex   . GERD (gastroesophageal reflux disease)   . Allergy     rhinitis  . Hyperlipidemia   . Mononucleosis 2009  . Severe ankle sprain 2006   Past Surgical History  Procedure Date  . Eye surgery     strabismus   History  Substance Use Topics  . Smoking status: Former Smoker    Quit date: 04/22/2007  . Smokeless tobacco: Not on file  . Alcohol Use:    Family History  Problem Relation Age of Onset  . Hyperlipidemia Mother     diet controlled  . Heart disease Father     CAD/ MI x 2  . Diabetes Father   . Parkinsonism Father     Allergies  Allergen Reactions  . Fexofenadine     REACTION: not effective  . Loratadine     REACTION: not effective   Current Outpatient Prescriptions on File Prior to Visit  Medication Sig Dispense Refill  . albuterol (PROAIR HFA) 108 (90 BASE) MCG/ACT inhaler Inhale 2 puffs into the lungs every 4 (four) hours as needed.        . fexofenadine (ALLEGRA) 180 MG tablet Take 180 mg by mouth daily.        . Multiple Vitamin (MULTIVITAMIN) tablet Take 1 tablet by mouth daily.        Marland Kitchen omeprazole (PRILOSEC) 20 MG capsule TAKE ONE BY MOUTH ONCE DAILY IN THE MORNING,ABOUT 30 MINUTES BEFORE BREAKFAST.  90 capsule  3  . simvastatin (ZOCOR) 20 MG tablet Take 1 tablet (20 mg total) by mouth at bedtime.  90 tablet  3  . traMADol (ULTRAM) 50 MG tablet Take 1 tablet (50 mg total) by mouth every 8 (eight) hours as needed for pain.  30 tablet  0     Review of Systems Review of Systems  Constitutional:  Negative for fever, appetite change, fatigue and unexpected weight change.  Eyes: Negative for pain and visual disturbance.  Respiratory: Negative for cough and shortness of breath.   Cardiovascular: Negative for cp or palpitations    Gastrointestinal: Negative for nausea, diarrhea and constipation.  Genitourinary: Negative for urgency and frequency. neg for blood in urine  Skin: Negative for pallor or rash   MSK pos for R lower back and leg pain that is severe at times Neurological: Negative for weakness, light-headedness, numbness and headaches.  Hematological: Negative for adenopathy. Does not bruise/bleed easily.  Psychiatric/Behavioral: Negative for dysphoric mood. The patient is not nervous/anxious.          Objective:   Physical Exam  Constitutional: He appears well-developed and well-nourished. No distress.  HENT:  Head: Normocephalic and atraumatic.  Mouth/Throat: Oropharynx is clear and moist.  Eyes: Conjunctivae and EOM are normal. Pupils are equal, round, and reactive to light. No  scleral icterus.  Neck: Normal range of motion. Neck supple. No JVD present. Carotid bruit is not present. Erythema present. No thyromegaly present.  Cardiovascular: Regular rhythm, normal heart sounds and intact distal pulses.  Exam reveals no gallop.        Tachycardia mild   Pulmonary/Chest: Effort normal and breath sounds normal. No respiratory distress. He has no wheezes.  Abdominal: Soft. Bowel sounds are normal. He exhibits no distension, no abdominal bruit and no mass. There is no tenderness.       No cva tenderness No suprapubic tenderness    Musculoskeletal: He exhibits tenderness. He exhibits no edema.       LS tenderness   Lymphadenopathy:    He has no cervical adenopathy.  Neurological: He is alert. He has normal reflexes.  Skin: Skin is warm and dry. No rash noted. No erythema. No pallor.  Psychiatric: He has a normal mood and affect.          Assessment & Plan:

## 2011-06-10 NOTE — Patient Instructions (Signed)
Labs today  We will do cardiology consult referral at check out  Drink lots of water No change in medicines  Will update you when we get results

## 2011-06-10 NOTE — Assessment & Plan Note (Signed)
With last cr 1.7 Pt admits to heavy nsaid use at the time (also had cipro for epididymitis in dec) , as well as poor fluid intake Now is off nsaid/ drinking lots of water ua nl  Rev urol note dec - no more testicular pain Does however have hx of kidney stones  Pend result today- exp imp

## 2011-06-12 ENCOUNTER — Telehealth: Payer: Self-pay

## 2011-06-12 NOTE — Telephone Encounter (Signed)
Pt said needs letter for work updating them about status of pt being out of work. Pt has cardiology appt 03-.01-13. Pt said work needs something in writing from doctor rather than pt telling them what is going on with condition.Pt can be reached at [phone].

## 2011-06-13 NOTE — Telephone Encounter (Signed)
Pt said it is OK to list rapid heart rate; pt said employer needs to know what is going on.

## 2011-06-13 NOTE — Telephone Encounter (Signed)
Is it ok if I note his diagnosis (re: rapid heart rate) or would he just like me to specify "medical condition"-- ? How much he wants his employer to know  Thanks

## 2011-06-16 NOTE — Telephone Encounter (Signed)
His letter is in the IN box

## 2011-06-16 NOTE — Telephone Encounter (Signed)
Patient notified as instructed by telephone. Letter at front desk for patient to pick up as instructed.  Pt will pick up letter at front desk today.

## 2011-06-20 ENCOUNTER — Encounter: Payer: Self-pay | Admitting: Cardiovascular Disease

## 2011-06-20 ENCOUNTER — Ambulatory Visit (INDEPENDENT_AMBULATORY_CARE_PROVIDER_SITE_OTHER): Payer: BC Managed Care – PPO | Admitting: Cardiovascular Disease

## 2011-06-20 VITALS — BP 137/88 | HR 77 | Ht 70.0 in | Wt 219.0 lb

## 2011-06-20 DIAGNOSIS — Z01818 Encounter for other preprocedural examination: Secondary | ICD-10-CM

## 2011-06-20 DIAGNOSIS — R000 Tachycardia, unspecified: Secondary | ICD-10-CM

## 2011-06-20 DIAGNOSIS — E785 Hyperlipidemia, unspecified: Secondary | ICD-10-CM

## 2011-06-20 NOTE — Patient Instructions (Signed)
You are doing well. No medication changes were made.  Please call us if you have new issues that need to be addressed before your next appt.    

## 2011-06-20 NOTE — Progress Notes (Signed)
Patient ID: JAKHARI SPACE, male    DOB: 1975-10-08, 36 y.o.   MRN: [id number]  HPI Comments: Mr. Billy Blevins is a very pleasant 36 year old gentleman who is a patient of Dr. Milinda Antis, history of recent back pain over the past several months, asthma with rare use of albuterol, presenting for evaluation of tachycardia as part of his preoperative evaluation before back surgery with Dr. Shon Baton at [facility].  He reports that he is asymptomatic and does not feel SOB. 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 He is typically active though has had some limitations secondary to his back pain over the past 3 months. Pain at times is moderate. He has not had a cardiac history in the past. He does have a strong family history of coronary artery disease. He reports having a 20 pound weight gain over the past several years. He and his wife are expecting a child in 3 months. Blood pressure typically is relatively well-controlled. He has been monitoring his heart rate at home using an apple application with average heart rate in the 70s to mid-90s. Heart rate does go up with exertion and stress as well as pain.  EKG shows normal sinus rhythm with rate 77 beats per minute with no significant ST or T wave changes   Outpatient Encounter Prescriptions as of 06/20/2011  Medication Sig Dispense Refill  . albuterol (PROAIR HFA) 108 (90 BASE) MCG/ACT inhaler Inhale 2 puffs into the lungs every 4 (four) hours as needed.        . fexofenadine (ALLEGRA) 180 MG tablet Take 180 mg by mouth daily.        . Multiple Vitamin (MULTIVITAMIN) tablet Take 1 tablet by mouth daily.        Marland Kitchen omeprazole (PRILOSEC) 20 MG capsule TAKE ONE BY MOUTH ONCE DAILY IN THE MORNING,ABOUT 30 MINUTES BEFORE BREAKFAST.  90 capsule  3  . simvastatin (ZOCOR) 20 MG tablet Take 1 tablet (20 mg total) by mouth at bedtime.  90 tablet  3  . traMADol (ULTRAM) 50 MG tablet Take 50 mg by mouth as needed.          Review of Systems  Constitutional: Negative.   HENT: Negative.   Eyes: Negative.   Respiratory: Negative.   Cardiovascular: Negative.   Gastrointestinal: Negative.   Musculoskeletal: Positive for back pain.  Skin: Negative.   Neurological: Negative.   Hematological: Negative.   Psychiatric/Behavioral: Negative.   All other systems reviewed and are negative.    BP 137/88  Pulse 77  Ht 5\' 10"  (1.778 m)  Wt 219 lb (99.338 kg)  BMI 31.42 kg/m2   Physical Exam  Nursing note and vitals reviewed. Constitutional: He is oriented to person, place, and time. He appears well-developed and well-nourished.  HENT:  Head: Normocephalic.  Nose: Nose normal.  Mouth/Throat: Oropharynx is clear and moist.  Eyes: Conjunctivae are normal. Pupils are equal, round, and reactive to light.  Neck: Normal range of motion. Neck supple. No JVD present.  Cardiovascular: Normal rate, regular rhythm, S1 normal, S2 normal, normal heart sounds and intact distal pulses.  Exam reveals no gallop and no friction rub.   No murmur heard. Pulmonary/Chest: Effort normal and breath sounds normal. No respiratory distress. He has no wheezes. He has no rales. He exhibits no tenderness.  Abdominal: Soft. Bowel sounds are normal. He exhibits no distension. There is no tenderness.  Musculoskeletal: Normal range of motion. He exhibits no edema  and no tenderness.  Lymphadenopathy:    He has no cervical adenopathy.  Neurological: He is alert and oriented to person, place, and time. Coordination normal.  Skin: Skin is warm and dry. No rash noted. No erythema.  Psychiatric: He has a normal mood and affect. His behavior is normal. Judgment and thought content normal.           Assessment and Plan

## 2011-06-20 NOTE — Assessment & Plan Note (Signed)
Rate on today's visit and per his measurement at home is within a 72 mid 90 range. As he is relatively asymptomatic, would not start any medications at this time. If medications are needed in the future for hypertension, low dose beta blocker could be used. We've asked him to continue to monitor his rhythm for now. Would probably start low-dose beta blocker for heart rate consistently greater than 100. Have asked him to work on his conditioning, increasing his daily exercise as this would probably help his baseline rate.

## 2011-06-20 NOTE — Assessment & Plan Note (Signed)
He would be acceptable risk for his upcoming back surgery. No further testing is needed.

## 2011-06-20 NOTE — Assessment & Plan Note (Signed)
Cholesterol is at goal on the current lipid regimen. No changes to the medications were made. We have asked him to start exercise in an effort to lose a small amount of weight.  Weight is up 20 pounds over the past year or so.

## 2011-07-01 ENCOUNTER — Encounter (HOSPITAL_COMMUNITY): Payer: Self-pay | Admitting: Pharmacy Technician

## 2011-07-01 ENCOUNTER — Telehealth: Payer: Self-pay

## 2011-07-01 NOTE — Telephone Encounter (Signed)
Note faxed.

## 2011-07-01 NOTE — Telephone Encounter (Signed)
Ok to have back surgery Acceptable risk. No further workup needed No special precautions before during after

## 2011-07-01 NOTE — Telephone Encounter (Signed)
Billy Blevins is needing a cardiac clearance for Spine: L5-S1 Disectomy left side. His surgery has not yet been scheduled pending clearance. Please fax over any recommendations or contraindications for the patient prior to surgery, during the procedure, or in the post-operative period.  Please advise.

## 2011-07-04 ENCOUNTER — Encounter (HOSPITAL_COMMUNITY)
Admission: RE | Admit: 2011-07-04 | Discharge: 2011-07-04 | Disposition: A | Discharge status: Home / Self Care | Payer: BC Managed Care – PPO | Source: Ambulatory Visit | Attending: Anesthesiology | Admitting: Anesthesiology

## 2011-07-04 ENCOUNTER — Encounter (HOSPITAL_COMMUNITY)
Admission: RE | Admit: 2011-07-04 | Discharge: 2011-07-04 | Disposition: A | Discharge status: Home / Self Care | Payer: BC Managed Care – PPO | Source: Ambulatory Visit | Attending: Orthopedic Surgery | Admitting: Orthopedic Surgery

## 2011-07-04 ENCOUNTER — Encounter (HOSPITAL_COMMUNITY)
Admission: RE | Admit: 2011-07-04 | Discharge: 2011-07-04 | Disposition: A | Discharge status: Home / Self Care | Payer: BC Managed Care – PPO | Source: Ambulatory Visit | Attending: Physician Assistant | Admitting: Physician Assistant

## 2011-07-04 ENCOUNTER — Encounter (HOSPITAL_COMMUNITY): Payer: Self-pay

## 2011-07-04 HISTORY — DX: Other chronic pain: G89.29

## 2011-07-04 HISTORY — DX: Dorsalgia, unspecified: M54.9

## 2011-07-04 LAB — CBC
MCH: 29.8 pg (ref 26.0–34.0)
MCV: 82.1 fL (ref 78.0–100.0)
Platelets: 184 10*3/uL (ref 150–400)
RDW: 12.7 % (ref 11.5–15.5)

## 2011-07-04 LAB — BASIC METABOLIC PANEL
CO2: 26 mEq/L (ref 19–32)
Calcium: 9.6 mg/dL (ref 8.4–10.5)
Creatinine, Ser: 0.87 mg/dL (ref 0.50–1.35)
Glucose, Bld: 91 mg/dL (ref 70–99)

## 2011-07-04 NOTE — Progress Notes (Addendum)
Dr.Gollen with [facility] in [place](pt saw about 2wks ago)-report and EKG in epic Denies ever having an echo/stress test/heart cath

## 2011-07-04 NOTE — Pre-Procedure Instructions (Signed)
Billy Blevins  07/04/2011   Your procedure is scheduled on:  Wed, Mar Billy @ 0830  Report to [facility] at 0630 AM.  Call this number if you have problems the morning of surgery: [phone]   Remember:   Do not eat food:After Midnight.  May have clear liquids: up to 4 Hours before arrival.(until 2:30 am)  Clear liquids include soda, tea, black coffee, apple or grape juice, broth,water  Take these medicines the morning of surgery with A SIP OF WATER: Tramadol or Pain Pill(if needed),Prilosec,Allegra,and Albuterol(if needed)<Bring Your Inhaler With You>  Do not wear jewelry, make-up or nail polish.  Do not wear lotions, powders, or perfumes.    Do not bring valuables to the hospital.  Contacts, dentures or bridgework may not be worn into surgery.  Leave suitcase in the car. After surgery it may be brought to your room.  For patients admitted to the hospital, checkout time is 11:00 AM the day of discharge.   Patients discharged the day of surgery will not be allowed to drive home.    Special Instructions: CHG Shower Use Special Wash: 1/2 bottle night before surgery and 1/2 bottle morning of surgery.   Please read over the following fact sheets that you were given: Pain Booklet, Coughing and Deep Breathing, MRSA Information and Surgical Site Infection Prevention

## 2011-07-07 NOTE — H&P (Signed)
Billy Blevins 07/04/2011 2:27 PM [facility]: [facility] Patient #: [id number] DOB: Aug 28, 1975 Married / Language: [place] / Race: White Male   History of Present Illness([name], PA-C; 07/04/2011 3:24 PM) The patient is a 36 year old male who comes in today for a preoperative History and Physical. The patient is scheduled for a Lumbar L5-S1 disectomy left (for left leg pain) to be performed by Dr. Debria Garret D. Shon Baton, MD at [facility] on Wednesday, July 09, 2011 at 8:30AM (@830am ) . Please see the hospital record for complete dictated history and physical.    Allergies(Lori W Lamb; 07/04/2011 2:29 PM) No Known Drug Allergies. 05/08/2011   Family History(Raela Bohl J Anibal Quinby, PA-C; 07/04/2011 3:25 PM) Cerebrovascular Accident. grandmother mothers side and grandfather mothers side Diabetes Mellitus. father, grandmother mothers side and grandmother fathers side father and grandmother mothers side Heart Disease. father Heart disease in male family member before age 34 Hypertension. mother   Social History(Lauranne Beyersdorf J [facility], PA-C; 07/04/2011 3:25 PM) Tobacco use. former smoker; smoke(d) 3 or more pack(s) per day, former smoker; smoke(d) 1 1/2 pack(s) per day Alcohol use. current drinker; drinks beer; only occasionally per week Children. 1 Current work status. working full time Drug/Alcohol Rehab (Currently). no Drug/Alcohol Rehab (Previously). no Exercise. Exercises monthly; does running / walking Exercises weekly; does other Illicit drug use. no Living situation. live with spouse Marital status. married Number of flights of stairs before winded. 2-3 Pain Contract. no Tobacco / smoke exposure. no   Medication History(Lori W Lamb; 07/04/2011 2:29 PM) Simvastatin (20MG  Tablet, Oral) Active. PriLOSEC ( Oral) Specific dose unknown - Active. ZyrTEC-D Allergy & Congestion (5-120MG  Tablet ER 12HR, Oral) Active. Norco (5-325MG  Tablet, 1 (one)  Oral q 6-8 hours prn pain, Taken 05/20/2011 to 06/19/2011) Inactive. Ultram (50MG  Tablet, Oral) Active. (prn)   Past Surgical History(Christian Treadway J [facility], PA-C; 07/04/2011 3:25 PM) Tonsillectomy Left eye surgery as a child   Other Problems(Shantal Roan J Ameliah Baskins, PA-C; 07/04/2011 3:25 PM) Chronic Pain Hypercholesterolemia Kidney Stone   Review of Systems(Lezley Bedgood J [facility], PA-C; 07/04/2011 3:25 PM) General:Not Present- Chills, Fever, Night Sweats, Appetite Loss, Fatigue, Feeling sick, Weight Gain and Weight Loss. Skin:Not Present- Itching, Rash, Skin Color Changes, Ulcer, Psoriasis and Change in Hair or Nails. HEENT:Not Present- Sensitivity to light, Hearing problems, Nose Bleed and Ringing in the Ears. Neck:Not Present- Swollen Glands and Neck Mass. Respiratory:Not Present- Snoring, Chronic Cough, Bloody sputum and Dyspnea. Cardiovascular:Not Present- Shortness of Breath, Chest Pain, Swelling of Extremities, Leg Cramps and Palpitations. Gastrointestinal:Present- Heartburn. Not Present- Bloody Stool, Abdominal Pain, Vomiting, Nausea and Incontinence of Stool. Male Genitourinary:Not Present- Blood in Urine, Frequency, Incontinence and Nocturia. Musculoskeletal:Present- Back Pain. Not Present- Muscle Weakness, Muscle Pain, Joint Stiffness, Joint Swelling and Joint Pain. Neurological:Present- Tingling, Numbness and Burning. Not Present- Tremor, Headaches and Dizziness. Psychiatric:Not Present- Anxiety, Depression and Memory Loss. Endocrine:Not Present- Cold Intolerance, Heat Intolerance, Excessive hunger and Excessive Thirst. Hematology:Not Present- Abnormal Bleeding, Anemia, Blood Clots and Easy Bruising.   Vitals(Lori W Lamb; 07/04/2011 2:37 PM) 07/04/2011 2:30 PM Weight: 218 lb Height: 70 in Body Surface Area: 2.21 m Body Mass Index: 31.28 kg/m Pulse: 95 (Regular) BP: 141/78 (Sitting, Left Arm, Standard)    Physical Exam(Chardae Mulkern J Shronda Boeh, PA-C; 07/07/2011 3:09  PM) The physical exam findings are as follows:   General General Appearance- pleasant. Not in acute distress. Orientation- Oriented X3. Build & Nutrition- Well nourished and Well developed. Posture- Normal posture. Gait- Normal. Mental Status- Alert.   Integumentary Lumbar Spine- Skin examination of  the lumbar spine is without deformity, skin lesions, lacerations or abrasions.   Head and Neck Neck Global Assessment- supple. no lymphadenopathy and no nucchal rigidty.   Eye Pupil- Bilateral- Normal, Direct reaction to light normal, Equal and Regular. Motion- Bilateral- EOMI.   Chest and Lung Exam Auscultation: Breath sounds:- Clear.   Cardiovascular Auscultation:Rhythm- Regular rate and rhythm. Heart Sounds- Normal heart sounds.   Abdomen Palpation/Percussion:Palpation and Percussion of the abdomen reveal - Non Tender, No Rebound tenderness and Soft.   Peripheral Vascular Lower Extremity:Inspection- Bilateral- Inspection Normal. Palpation:Posterior tibial pulse- Bilateral- 2+. Dorsalis pedis pulse- Bilateral- 2+.   Neurologic Sensation:Lower Extremity- Bilateral- sensation is intact in the lower extremity. Reflexes:Patellar Reflex- Bilateral- 2+. Achilles Reflex- Bilateral- 2+. Babinski- Bilateral- Babinski not present. Clonus- Bilateral- clonus not present. Hoffman's Sign- Bilateral- Hoffman's sign not present. Testing:Seated Straight Leg Raise- Bilateral- Seated straight leg raise negative.   Musculoskeletal Spine/Ribs/Pelvis Lumbosacral Spine:Inspection and Palpation- Tenderness- generalized. bony and soft tissue palpation of the lumbar spine and SI joint does not recreate their typical pain. Strength and Tone: Strength:Hip Flexion- Bilateral- 5/5. Knee Extension- Bilateral- 5/5. Knee Flexion- Bilateral- 5/5. Ankle Dorsiflexion- Bilateral- 5/5. Ankle Plantarflexion- Bilateral- 5/5. Heel walk-  Bilateral- able to heel walk without difficulty. Toe Walk- Bilateral- able to walk on toes without difficulty. Heel-Toe Walk- Bilateral- able to heel-toe walk without difficulty. ROM- Flexion- mildly decreased range of motion and painful. Extension- mildly decreased range of motion and painful. Pain:- neither flexion or extension is more painful than the other. Waddell's Signs- no Waddell's signs present. Lower Extremity Range of Motion:- No true hip, knee or ankle pain with range of motion. Gait and Station:Assistive Devices- no assistive devices.   Assessment & Plan(Annalisia Ingber J [facility], PA-C; 07/04/2011 3:23 PM) Note: unfortunately conservative measures consiting of observation and activity modification have failed to alleviate his symptoms and given the ongoing natur eof his pain and the decrease in his quality of life he wishes to proceed with surgery. Risks/benefits/alternatives to surgery/expectations following the procedure have been reviewed with the patient by Dr. Shon Baton.  MRI of the lumbar spine dated 05/10/2011 demonstrates L5-S1 moderate left lateral cranial extrusion displaces the L5 dorsal root ganglion  He has been medically cleared for surgery by his PCP, Dr. Milinda Antis as well as his cardiologist Dr. Mariah Milling. Please see the scanned form in the office chart for specifics. He is scheudled to be fitted for a corsett brace on Tuesday at 12PM. He completed his pre-op hopistal requirements earlier today.   Plan, at this time, is to proceed with surgery as scheduled. All of his questions have been encouraged addressed and answered.   Signed electronically by Gwinda Maine, PA-C (07/07/2011 3:10 PM)  Billy Blevins 05/20/2011 8:46 AM Location: [facility] Patient #: [id number] DOB: March 05, 1976 Married / Language: [place] / Race: White Male   History of Present Illness(Sharon Gillian Shields; 05/20/2011 8:50 AM) The patient is a 36 year old male who presents  today for follow up of their back. The patient is being followed for their back pain. They are now 1 month(s) out from when symptoms began. The patient states that they are doing poorly. The following medication has been used for pain control: Tylenol. The patient reports their current pain level to be mild to moderate. The patient presents today following MRI (lumbar). Note for "Follow-up back": pain radiates into left leg with spasms and numbness into great toe. Difficult to sleep at night    Subjective Transcription(DAHARI Sheela Stack, MD; 05/20/2011 12:43  PM)  Sparsh returns for a followup. We are now six weeks into his back, buttock and left leg pain. The patient states his pain has started to recur, quite severe. This did improve a little bit with a Medrol dosepak but that was only temporary.        Allergies(Sharon Gillian Shields; 05/20/2011 8:50 AM) No Known Drug Allergies. 05/08/2011   Social History(Sharon J Roxan Hockey; 05/20/2011 8:50 AM) Tobacco / smoke exposure. no   Medication History(Sharon Gillian Shields; 05/20/2011 8:50 AM) PredniSONE (Pak) (10MG  Tablet, 1 Oral take as directed for 6 days, Taken starting 05/08/2011) Active. Diclofenac Sodium (75MG  Tablet, Oral) Active. Cyclobenzaprine HCl ( Oral) Specific dose unknown - Active. Simvastatin (20MG  Tablet, Oral) Active. PriLOSEC ( Oral) Specific dose unknown - Active. ZyrTEC-D Allergy & Congestion (5-120MG  Tablet ER 12HR, Oral) Active.   Vitals(Sharon Gillian Shields; 05/20/2011 8:50 AM) 05/20/2011 8:50 AM Weight: 222 lb Height: 71 in Body Surface Area: 2.25 m Body Mass Index: 30.96 kg/m    Objective Transcription(DAHARI D BROOKS, MD; 05/20/2011 12:43 PM)  At this point he still complains of severe pain going from the back into the left lateral aspect of his calf and into the left dorsum of the foot towards the big toe. This is directly in the L5 distribution.    RADIOGRAPHS:  The MRI from 05-10-11 clearly  demonstrates an extruded far lateral disk herniation at L5-S1 going to the left. This displaces the L5 dorsal root ganglion. 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      Assessment & Plan(Sharon Gillian Shields; 05/20/2011 9:22 AM) Pain, Lumbar (LBP) (724.2) Current Plans l Started Norco 5-325MG , 1 (one) Tablet q 6-8 hours prn pain, #90, 30 days starting 05/20/2011, No Refill.   Plans Transcription(DAHARI D BROOKS, MD; 05/20/2011 12:43 PM)  At this point in time the patient has a left lateral cranial extrusion displacing the left L5 dorsal root ganglion in the foramen. He has had appropriate conservative treatment consisting of a self directed exercise program, Medrol dosepak and observation. Despite this, over the last six weeks his quality of life has continued to deteriorate. As a result, he would like to pursue surgical intervention. We did discuss injection therapy and observation. The risks of surgery as I have explained them include infection, bleeding, nerve damage, death, stroke, paralysis, failure to heal, need for further surgery, ongoing or worsening pain, loss of bowel or bladder control, need for further surgery, recurrent disk herniation, bleeding, blood clots, significant increase in pain in the back, buttock and leg, need for fusion surgery in the future.    I have explained to him the routine post operative course. I have discussed the surgery in detail with him. All of his questions were encouraged and addressed. I have given him some additional reviewing material. He would like to set this up for as soon as possible. We will go ahead and do this.      Miscellaneous Transcription(DAHARI Sheela Stack, MD; 05/20/2011 12:43 PM)  Debria Garret D. Shon Baton, MD Norman Herrlich    T:  05-20-11  D: 05-20-11    Signed electronically by Alvy Beal, MD (05/20/2011 1:56 PM)

## 2011-07-08 MED ORDER — CEFAZOLIN SODIUM-DEXTROSE 2-3 GM-% IV SOLR
2.0000 g | INTRAVENOUS | Status: AC
  Administered 2011-07-09: 2 g via INTRAVENOUS
  Filled 2011-07-08: qty 50

## 2011-07-09 ENCOUNTER — Ambulatory Visit (HOSPITAL_COMMUNITY)
Admission: RE | Admit: 2011-07-09 | Discharge: 2011-07-10 | Disposition: A | Discharge status: Home / Self Care | Payer: BC Managed Care – PPO | Source: Ambulatory Visit | Attending: Orthopedic Surgery | Admitting: Orthopedic Surgery

## 2011-07-09 ENCOUNTER — Ambulatory Visit (HOSPITAL_COMMUNITY): Payer: BC Managed Care – PPO

## 2011-07-09 ENCOUNTER — Encounter (HOSPITAL_COMMUNITY)
Admission: RE | Disposition: A | Discharge status: Home / Self Care | Payer: Self-pay | Source: Ambulatory Visit | Attending: Orthopedic Surgery

## 2011-07-09 ENCOUNTER — Encounter (HOSPITAL_COMMUNITY): Payer: Self-pay

## 2011-07-09 ENCOUNTER — Encounter (HOSPITAL_COMMUNITY): Payer: Self-pay | Admitting: *Deleted

## 2011-07-09 DIAGNOSIS — K219 Gastro-esophageal reflux disease without esophagitis: Secondary | ICD-10-CM | POA: Insufficient documentation

## 2011-07-09 DIAGNOSIS — M5126 Other intervertebral disc displacement, lumbar region: Secondary | ICD-10-CM | POA: Insufficient documentation

## 2011-07-09 DIAGNOSIS — G8929 Other chronic pain: Secondary | ICD-10-CM | POA: Insufficient documentation

## 2011-07-09 DIAGNOSIS — M5136 Other intervertebral disc degeneration, lumbar region: Secondary | ICD-10-CM

## 2011-07-09 DIAGNOSIS — E7800 Pure hypercholesterolemia, unspecified: Secondary | ICD-10-CM | POA: Insufficient documentation

## 2011-07-09 DIAGNOSIS — Z01812 Encounter for preprocedural laboratory examination: Secondary | ICD-10-CM | POA: Insufficient documentation

## 2011-07-09 DIAGNOSIS — E785 Hyperlipidemia, unspecified: Secondary | ICD-10-CM | POA: Insufficient documentation

## 2011-07-09 HISTORY — PX: LUMBAR LAMINECTOMY/DECOMPRESSION MICRODISCECTOMY: SHX5026

## 2011-07-09 SURGERY — LUMBAR LAMINECTOMY/DECOMPRESSION MICRODISCECTOMY
Anesthesia: General | Site: Back | Wound class: Clean

## 2011-07-09 MED ORDER — PANTOPRAZOLE SODIUM 40 MG PO TBEC
40.0000 mg | DELAYED_RELEASE_TABLET | Freq: Every day | ORAL | Status: DC
  Administered 2011-07-10: 40 mg via ORAL
  Filled 2011-07-09: qty 1

## 2011-07-09 MED ORDER — FENTANYL CITRATE 0.05 MG/ML IJ SOLN
INTRAMUSCULAR | Status: AC
  Filled 2011-07-09: qty 2

## 2011-07-09 MED ORDER — ONDANSETRON HCL 4 MG/2ML IJ SOLN: 4.0000 mg | INTRAMUSCULAR | Status: DC | PRN

## 2011-07-09 MED ORDER — FENTANYL CITRATE 0.05 MG/ML IJ SOLN
25.0000 ug | INTRAMUSCULAR | Status: DC | PRN
  Administered 2011-07-09 (×2): 50 ug via INTRAVENOUS

## 2011-07-09 MED ORDER — MIDAZOLAM HCL 2 MG/2ML IJ SOLN: 0.5000 mg | Freq: Once | INTRAMUSCULAR | Status: DC | PRN

## 2011-07-09 MED ORDER — ACETAMINOPHEN 10 MG/ML IV SOLN
INTRAVENOUS | Status: AC
  Filled 2011-07-09: qty 100

## 2011-07-09 MED ORDER — PHENOL 1.4 % MT LIQD: 1.0000 | OROMUCOSAL | Status: DC | PRN

## 2011-07-09 MED ORDER — LACTATED RINGERS IV SOLN
INTRAVENOUS | Status: DC
  Administered 2011-07-09: 22:00:00 via INTRAVENOUS

## 2011-07-09 MED ORDER — SODIUM CHLORIDE 0.9 % IJ SOLN
3.0000 mL | Freq: Two times a day (BID) | INTRAMUSCULAR | Status: DC
  Administered 2011-07-09: 3 mL via INTRAVENOUS

## 2011-07-09 MED ORDER — OXYCODONE HCL 5 MG PO TABS
10.0000 mg | ORAL_TABLET | ORAL | Status: DC | PRN
  Administered 2011-07-09 – 2011-07-10 (×3): 10 mg via ORAL
  Filled 2011-07-09 (×3): qty 2

## 2011-07-09 MED ORDER — SODIUM CHLORIDE 0.9 % IJ SOLN: 3.0000 mL | INTRAMUSCULAR | Status: DC | PRN

## 2011-07-09 MED ORDER — PANTOPRAZOLE SODIUM 40 MG IV SOLR: 40.0000 mg | Freq: Every day | INTRAVENOUS | Status: DC

## 2011-07-09 MED ORDER — NEOSTIGMINE METHYLSULFATE 1 MG/ML IJ SOLN
INTRAMUSCULAR | Status: DC | PRN
  Administered 2011-07-09: 3 mg via INTRAVENOUS

## 2011-07-09 MED ORDER — SODIUM CHLORIDE 0.9 % IV SOLN: 250.0000 mL | INTRAVENOUS | Status: DC

## 2011-07-09 MED ORDER — LIDOCAINE HCL (CARDIAC) 20 MG/ML IV SOLN
INTRAVENOUS | Status: DC | PRN
  Administered 2011-07-09: 25 mg via INTRAVENOUS

## 2011-07-09 MED ORDER — GLYCOPYRROLATE 0.2 MG/ML IJ SOLN
INTRAMUSCULAR | Status: DC | PRN
  Administered 2011-07-09: .4 mg via INTRAVENOUS

## 2011-07-09 MED ORDER — FENTANYL CITRATE 0.05 MG/ML IJ SOLN
INTRAMUSCULAR | Status: DC | PRN
  Administered 2011-07-09: 50 ug via INTRAVENOUS
  Administered 2011-07-09: 200 ug via INTRAVENOUS

## 2011-07-09 MED ORDER — LACTATED RINGERS IV SOLN: INTRAVENOUS | Status: DC

## 2011-07-09 MED ORDER — ACETAMINOPHEN 10 MG/ML IV SOLN
1000.0000 mg | Freq: Once | INTRAVENOUS | Status: AC
  Administered 2011-07-09: 1000 mg via INTRAVENOUS
  Filled 2011-07-09: qty 100

## 2011-07-09 MED ORDER — LACTATED RINGERS IV SOLN
INTRAVENOUS | Status: DC | PRN
  Administered 2011-07-09 (×2): via INTRAVENOUS

## 2011-07-09 MED ORDER — PROMETHAZINE HCL 25 MG/ML IJ SOLN: 6.2500 mg | INTRAMUSCULAR | Status: DC | PRN

## 2011-07-09 MED ORDER — ONDANSETRON HCL 4 MG/2ML IJ SOLN
INTRAMUSCULAR | Status: DC | PRN
  Administered 2011-07-09 (×2): 4 mg via INTRAVENOUS

## 2011-07-09 MED ORDER — THROMBIN 20000 UNITS EX KIT
PACK | CUTANEOUS | Status: DC | PRN
  Administered 2011-07-09: 09:00:00 via TOPICAL

## 2011-07-09 MED ORDER — 0.9 % SODIUM CHLORIDE (POUR BTL) OPTIME
TOPICAL | Status: DC | PRN
  Administered 2011-07-09: 1000 mL

## 2011-07-09 MED ORDER — ACETAMINOPHEN 325 MG PO TABS: 325.0000 mg | ORAL_TABLET | ORAL | Status: DC | PRN

## 2011-07-09 MED ORDER — DOCUSATE SODIUM 100 MG PO CAPS
100.0000 mg | ORAL_CAPSULE | Freq: Two times a day (BID) | ORAL | Status: DC
  Administered 2011-07-09: 100 mg via ORAL
  Filled 2011-07-09 (×2): qty 1

## 2011-07-09 MED ORDER — DEXAMETHASONE SODIUM PHOSPHATE 4 MG/ML IJ SOLN
4.0000 mg | Freq: Four times a day (QID) | INTRAMUSCULAR | Status: DC
  Administered 2011-07-09 – 2011-07-10 (×2): 4 mg via INTRAVENOUS
  Filled 2011-07-09 (×7): qty 1

## 2011-07-09 MED ORDER — ZOLPIDEM TARTRATE 10 MG PO TABS: 10.0000 mg | ORAL_TABLET | Freq: Every evening | ORAL | Status: DC | PRN

## 2011-07-09 MED ORDER — KETOROLAC TROMETHAMINE 30 MG/ML IJ SOLN
15.0000 mg | Freq: Once | INTRAMUSCULAR | Status: AC | PRN
  Administered 2011-07-09: 15 mg via INTRAVENOUS

## 2011-07-09 MED ORDER — METHOCARBAMOL 100 MG/ML IJ SOLN
500.0000 mg | Freq: Four times a day (QID) | INTRAVENOUS | Status: DC | PRN
  Administered 2011-07-09: 500 mg via INTRAVENOUS
  Filled 2011-07-09: qty 5

## 2011-07-09 MED ORDER — BUPIVACAINE-EPINEPHRINE 0.25% -1:200000 IJ SOLN
INTRAMUSCULAR | Status: DC | PRN
  Administered 2011-07-09: 10 mL

## 2011-07-09 MED ORDER — ROCURONIUM BROMIDE 100 MG/10ML IV SOLN
INTRAVENOUS | Status: DC | PRN
  Administered 2011-07-09: 50 mg via INTRAVENOUS

## 2011-07-09 MED ORDER — PROPOFOL 10 MG/ML IV EMUL
INTRAVENOUS | Status: DC | PRN
  Administered 2011-07-09: 200 mg via INTRAVENOUS

## 2011-07-09 MED ORDER — MIDAZOLAM HCL 5 MG/5ML IJ SOLN
INTRAMUSCULAR | Status: DC | PRN
  Administered 2011-07-09: 2 mg via INTRAVENOUS

## 2011-07-09 MED ORDER — ACETAMINOPHEN 10 MG/ML IV SOLN
1000.0000 mg | Freq: Four times a day (QID) | INTRAVENOUS | Status: AC
  Administered 2011-07-09 – 2011-07-10 (×4): 1000 mg via INTRAVENOUS
  Filled 2011-07-09 (×4): qty 100

## 2011-07-09 MED ORDER — CEFAZOLIN SODIUM 1-5 GM-% IV SOLN
1.0000 g | Freq: Three times a day (TID) | INTRAVENOUS | Status: AC
  Administered 2011-07-09 – 2011-07-10 (×2): 1 g via INTRAVENOUS
  Filled 2011-07-09 (×2): qty 50

## 2011-07-09 MED ORDER — METHOCARBAMOL 500 MG PO TABS
500.0000 mg | ORAL_TABLET | Freq: Four times a day (QID) | ORAL | Status: DC | PRN
  Filled 2011-07-09: qty 1

## 2011-07-09 MED ORDER — DEXAMETHASONE 4 MG PO TABS
4.0000 mg | ORAL_TABLET | Freq: Four times a day (QID) | ORAL | Status: DC
  Administered 2011-07-10 (×2): 4 mg via ORAL
  Filled 2011-07-09 (×7): qty 1

## 2011-07-09 MED ORDER — MORPHINE SULFATE 4 MG/ML IJ SOLN: 1.0000 mg | INTRAMUSCULAR | Status: DC | PRN

## 2011-07-09 MED ORDER — DEXAMETHASONE SODIUM PHOSPHATE 10 MG/ML IJ SOLN
10.0000 mg | Freq: Once | INTRAMUSCULAR | Status: AC
  Administered 2011-07-09: 10 mg via INTRAVENOUS
  Filled 2011-07-09: qty 1

## 2011-07-09 MED ORDER — ALBUTEROL SULFATE HFA 108 (90 BASE) MCG/ACT IN AERS
2.0000 | INHALATION_SPRAY | RESPIRATORY_TRACT | Status: DC | PRN
  Filled 2011-07-09: qty 6.7

## 2011-07-09 MED ORDER — MENTHOL 3 MG MT LOZG: 1.0000 | LOZENGE | OROMUCOSAL | Status: DC | PRN

## 2011-07-09 MED ORDER — MEPERIDINE HCL 25 MG/ML IJ SOLN: 6.2500 mg | INTRAMUSCULAR | Status: DC | PRN

## 2011-07-09 SURGICAL SUPPLY — 60 items
ADH SKN CLS APL DERMABOND .7 (GAUZE/BANDAGES/DRESSINGS) ×1
BUR EGG ELITE 4.0 (BURR) IMPLANT
BUR MATCHSTICK NEURO 3.0 LAGG (BURR) ×1 IMPLANT
CANISTER SUCTION 2500CC (MISCELLANEOUS) ×1 IMPLANT
CLOTH BEACON ORANGE TIMEOUT ST (SAFETY) ×2 IMPLANT
CLSR STERI-STRIP ANTIMIC 1/2X4 (GAUZE/BANDAGES/DRESSINGS) ×2 IMPLANT
CORDS BIPOLAR (ELECTRODE) ×2 IMPLANT
COVER SURGICAL LIGHT HANDLE (MISCELLANEOUS) ×2 IMPLANT
DERMABOND ADVANCED (GAUZE/BANDAGES/DRESSINGS) ×1
DERMABOND ADVANCED .7 DNX12 (GAUZE/BANDAGES/DRESSINGS) ×1 IMPLANT
DRAIN CHANNEL 15F RND FF W/TCR (WOUND CARE) ×2 IMPLANT
DRAPE POUCH INSTRU U-SHP 10X18 (DRAPES) ×2 IMPLANT
DRAPE PROXIMA HALF (DRAPES) ×1 IMPLANT
DRAPE SURG 17X23 STRL (DRAPES) ×2 IMPLANT
DRAPE U-SHAPE 47X51 STRL (DRAPES) ×2 IMPLANT
DRSG MEPILEX BORDER 4X4 (GAUZE/BANDAGES/DRESSINGS) ×2 IMPLANT
DRSG MEPILEX BORDER 4X8 (GAUZE/BANDAGES/DRESSINGS) ×2 IMPLANT
DURAPREP 26ML APPLICATOR (WOUND CARE) ×2 IMPLANT
ELECT BLADE 4.0 EZ CLEAN MEGAD (MISCELLANEOUS)
ELECT CAUTERY BLADE 6.4 (BLADE) ×2 IMPLANT
ELECT REM PT RETURN 9FT ADLT (ELECTROSURGICAL) ×2
ELECTRODE BLDE 4.0 EZ CLN MEGD (MISCELLANEOUS) IMPLANT
ELECTRODE REM PT RTRN 9FT ADLT (ELECTROSURGICAL) ×1 IMPLANT
EVACUATOR 1/8 PVC DRAIN (DRAIN) IMPLANT
EVACUATOR SILICONE 100CC (DRAIN) ×2 IMPLANT
GLOVE BIOGEL PI IND STRL 6.5 (GLOVE) ×1 IMPLANT
GLOVE BIOGEL PI IND STRL 8.5 (GLOVE) ×1 IMPLANT
GLOVE BIOGEL PI INDICATOR 6.5 (GLOVE) ×1
GLOVE BIOGEL PI INDICATOR 8.5 (GLOVE) ×1
GLOVE ECLIPSE 6.0 STRL STRAW (GLOVE) ×2 IMPLANT
GLOVE ECLIPSE 8.5 STRL (GLOVE) ×2 IMPLANT
GOWN PREVENTION PLUS XXLARGE (GOWN DISPOSABLE) ×2 IMPLANT
GOWN STRL NON-REIN LRG LVL3 (GOWN DISPOSABLE) ×4 IMPLANT
KIT BASIN OR (CUSTOM PROCEDURE TRAY) ×2 IMPLANT
KIT ROOM TURNOVER OR (KITS) ×2 IMPLANT
MANIFOLD NEPTUNE WASTE (CANNULA) ×2 IMPLANT
NDL SPNL 18GX3.5 QUINCKE PK (NEEDLE) ×2 IMPLANT
NEEDLE 22X1 1/2 (OR ONLY) (NEEDLE) ×2 IMPLANT
NEEDLE SPNL 18GX3.5 QUINCKE PK (NEEDLE) ×4 IMPLANT
NS IRRIG 1000ML POUR BTL (IV SOLUTION) ×2 IMPLANT
PACK LAMINECTOMY ORTHO (CUSTOM PROCEDURE TRAY) ×2 IMPLANT
PACK UNIVERSAL I (CUSTOM PROCEDURE TRAY) ×2 IMPLANT
PAD ARMBOARD 7.5X6 YLW CONV (MISCELLANEOUS) ×4 IMPLANT
PATTIES SURGICAL .5 X.5 (GAUZE/BANDAGES/DRESSINGS) IMPLANT
PATTIES SURGICAL .5 X1 (DISPOSABLE) ×1 IMPLANT
SPONGE SURGIFOAM ABS GEL 100 (HEMOSTASIS) ×2 IMPLANT
STRIP CLOSURE SKIN 1/2X4 (GAUZE/BANDAGES/DRESSINGS) ×2 IMPLANT
SURGIFLO TRUKIT (HEMOSTASIS) ×2 IMPLANT
SUT MNCRL AB 3-0 PS2 18 (SUTURE) ×2 IMPLANT
SUT VIC AB 0 CT1 27 (SUTURE) ×2
SUT VIC AB 0 CT1 27XBRD ANBCTR (SUTURE) ×1 IMPLANT
SUT VIC AB 1 CTX 36 (SUTURE) ×4
SUT VIC AB 1 CTX36XBRD ANBCTR (SUTURE) ×2 IMPLANT
SUT VIC AB 2-0 CT1 18 (SUTURE) ×4 IMPLANT
SYR BULB IRRIGATION 50ML (SYRINGE) ×2 IMPLANT
SYR CONTROL 10ML LL (SYRINGE) ×2 IMPLANT
TOWEL OR 17X24 6PK STRL BLUE (TOWEL DISPOSABLE) ×2 IMPLANT
TOWEL OR 17X26 10 PK STRL BLUE (TOWEL DISPOSABLE) ×2 IMPLANT
WATER STERILE IRR 1000ML POUR (IV SOLUTION) ×1 IMPLANT
YANKAUER SUCT BULB TIP NO VENT (SUCTIONS) ×2 IMPLANT

## 2011-07-09 NOTE — Brief Op Note (Signed)
07/09/2011  11:08 AM  PATIENT:  Billy Blevins  36 y.o. male  PRE-OPERATIVE DIAGNOSIS:  L5-S1 LATERAL DISCORNEATION WITH RADICULOPATHY LEFT SIDE  POST-OPERATIVE DIAGNOSIS:  LUMBAR 5-SACRAL 1 LATERAL DISCORNEATION WITH LEFT SIDE RADICULOPATHY  PROCEDURE:  Procedure(s) (LRB): LUMBAR LAMINECTOMY/DECOMPRESSION MICRODISCECTOMY (N/A)  SURGEON:  Surgeon(s) and Role:    * Venita Lick, MD - Primary  PHYSICIAN ASSISTANT:   ASSISTANTS: Norval Gable   ANESTHESIA:   general  EBL:  Total I/O In: 1000 [I.V.:1000] Out: -   BLOOD ADMINISTERED:none  DRAINS: none   LOCAL MEDICATIONS USED:  MARCAINE     SPECIMEN:  No Specimen  DISPOSITION OF SPECIMEN:  N/A  COUNTS:  YES  TOURNIQUET:  * No tourniquets in log *  DICTATION: .Other Dictation: Dictation Number [id number]  PLAN OF CARE: Admit for overnight observation  PATIENT DISPOSITION:  PACU - hemodynamically stable.

## 2011-07-09 NOTE — Anesthesia Preprocedure Evaluation (Signed)
Anesthesia Evaluation  Patient identified by MRN, date of birth, ID band Patient awake    Reviewed: Allergy & Precautions, H&P , NPO status , Patient's Chart, lab work & pertinent test results  History of Anesthesia Complications Negative for: history of anesthetic complications  Airway Mallampati: I TM Distance: >3 FB Neck ROM: Full    Dental  (+) Teeth Intact   Pulmonary  breath sounds clear to auscultation        Cardiovascular Rhythm:Regular Rate:Normal     Neuro/Psych    GI/Hepatic GERD-  Medicated and Controlled,  Endo/Other    Renal/GU      Musculoskeletal   Abdominal   Peds  Hematology   Anesthesia Other Findings   Reproductive/Obstetrics                           Anesthesia Physical Anesthesia Plan  ASA: II  Anesthesia Plan: General   Post-op Pain Management:    Induction: Intravenous  Airway Management Planned: Oral ETT  Additional Equipment:   Intra-op Plan:   Post-operative Plan: Extubation in OR  Informed Consent: I have reviewed the patients History and Physical, chart, labs and discussed the procedure including the risks, benefits and alternatives for the proposed anesthesia with the patient or authorized representative who has indicated his/her understanding and acceptance.   Dental advisory given  Plan Discussed with: Anesthesiologist and Surgeon  Anesthesia Plan Comments:         Anesthesia Quick Evaluation

## 2011-07-09 NOTE — H&P (Signed)
No change in clinical exam H+P reviewed  

## 2011-07-09 NOTE — Op Note (Signed)
Billy Blevins, MEADER NO.:  [id number]  MEDICAL RECORD NO.:  [id number]  LOCATION:  [facility]                         FACILITY:  [facility]  PHYSICIAN:  Alvy Beal, MD    DATE OF BIRTH:  [birth year]  DATE OF PROCEDURE:  07/09/2011 DATE OF DISCHARGE:                              OPERATIVE REPORT   PREOPERATIVE DIAGNOSIS:  Far lateral L5-S1 disk herniation.  POSTOPERATIVE DIAGNOSIS:  Far lateral L5-S1 disk herniation.  OPERATIVE PROCEDURE:  L5-S1 far lateral discectomy via Wiltse approach.  COMPLICATIONS:  None.  CONDITION:  Stable.  FIRST ASSISTANT:  Norval Gable, PA  HISTORY:  This is a pleasant 36 year old gentleman who presents with significant left leg pain.  Despite appropriate conservative management, consisting of physical therapy, injection therapy, activity modification, he continued to deteriorate.  Ultimately, after failing conservative management, he elected to proceed with surgery because of the severe radicular leg pain.  All appropriate risks, benefits, and alternatives were discussed with the patient.  Consent was obtained.  OPERATIVE NOTE:  The patient was brought to the operating room, placed supine on the operating table.  After successful induction of general anesthesia and endotracheal intubation and TEDs and SCDs were applied, he was turned prone onto a Wilson frame, and all bony prominences were well padded.  The back had been initialized prior identifying the left leg as the symptomatic side.  This again was confirmed with our time- out.  Once this was done, 2 needles were placed into the back confirming incision site on x-ray.  Once I identified the L5-S1 disk space and the L5 foramen, I made an incision 1 fingerbreadth to the left side of midline and sharply dissected through the adipose tissue down to the fascia.  The fascia was incised.  I then mobilized the paraspinal muscles down to expose the pars and L4-5 and L5-S1 facet  complexes. This allowed me to identify the L5 pars.  Once this was identified, I placed a Penfield 4 at the level of the L5-S1 foramen.  Intraoperative x- ray confirmed that I was at the appropriate level.  Once this was confirmed, I used a small nerve hook to develop a plane to release the facet capsule and ligamentum flavum from the pars and then I used a 2-mm Kerrison to perform a small lateral pars resection.  I maintained that I was less than 50% off the pars, less than half of the pars was resected.  I then mobilized the underlying ligamentum flavum and removed this.  I could now visualize the L5 nerve root and the neural foramen.  I then used a Penfield 4 to identify the L5 inferior pedicle and the superior aspect of the S1 pedicle.  At this point, I could now clearly identify the superior and inferior aspects of the foramen and identified the nerve root itself.  I gently and carefully manipulated the nerve root, dissected inferior to it until I can identify the disk space and the herniation.  Using a [facility], I probed medially until I was just on the lateral side of the thecal sac. At this point, I could now palpate the L5 nerve root in the foramen and  visualized the majority of it.  I then identified the disk herniation itself.  This was incised and then using a nerve hook, I began to mobilize the fragment of disk material.  I then used a micro pituitary rongeur to remove it.  At this point, I then used bipolar electrocautery to obtain hemostasis.  Using a nerve hook, I was able to palpate underneath the nerve root and proceeded medial until I was at the level of the thecal sac.  This confirmed the satisfactory decompression of the nerve root in the foramen itself.  I had adequately removed all the disk herniated fragments.  I irrigated copiously with normal saline.  I then used a Public house manager in order to confirm that inferior to the L5 nerve root in the foramen  was also adequately decompressed.  At this point, with the diskectomy complete and an adequate foraminal decompression complete and the nerve being adequately decompressed, I irrigated copiously with normal saline.  I obtained hemostasis using bipolar electrocautery and then maintained it using FloSeal.  Once I had hemostasis, I irrigated again and then placed thrombin-soaked Gelfoam patty over the exposed nerve root.  I then closed the deep fascia with interrupted #1 Vicryl suture, superficial 2-0 Vicryl sutures, and a 3-0 Monocryl for the skin.  Steri-Strips, dry dressings were applied.  The patient was extubated and transferred to PACU without incident.  At the end of the case, all needle and sponge counts were correct.     Alvy Beal, MD     DDB/MEDQ  D:  07/09/2011  T:  07/09/2011  Job:  [id number]

## 2011-07-09 NOTE — Anesthesia Postprocedure Evaluation (Signed)
Anesthesia Post Note  Patient: Billy Blevins  Procedure(s) Performed: Procedure(s) (LRB): LUMBAR LAMINECTOMY/DECOMPRESSION MICRODISCECTOMY (N/A)  Anesthesia type: GA  Patient location: PACU  Post pain: Pain level controlled  Post assessment: Post-op Vital signs reviewed  Last Vitals:  Filed Vitals:   07/09/11 1143  BP:   Pulse:   Temp: 36.6 C  Resp:     Post vital signs: Reviewed  Level of consciousness: sedated  Complications: No apparent anesthesia complications

## 2011-07-09 NOTE — Transfer of Care (Signed)
Immediate Anesthesia Transfer of Care Note  Patient: Billy Blevins  Procedure(s) Performed: Procedure(s) (LRB): LUMBAR LAMINECTOMY/DECOMPRESSION MICRODISCECTOMY (N/A)  Patient Location: PACU  Anesthesia Type: General  Level of Consciousness: awake, alert  and oriented  Airway & Oxygen Therapy: Patient Spontanous Breathing and Patient connected to nasal cannula oxygen  Post-op Assessment: Report given to PACU RN and Post -op Vital signs reviewed and stable  Post vital signs: Reviewed and stable  Complications: No apparent anesthesia complications

## 2011-07-09 NOTE — Anesthesia Procedure Notes (Signed)
Procedure Name: Intubation Date/Time: 07/09/2011 8:43 AM Performed by: Fuller Canada Pre-anesthesia Checklist: Patient identified, Timeout performed, Emergency Drugs available, Suction available and Patient being monitored Patient Re-evaluated:Patient Re-evaluated prior to inductionOxygen Delivery Method: Circle system utilized Preoxygenation: Pre-oxygenation with 100% oxygen Intubation Type: IV induction Ventilation: Mask ventilation without difficulty Laryngoscope Size: Mac and 4 Grade View: Grade I Tube size: 7.5 mm Number of attempts: 1 Airway Equipment and Method: Stylet Placement Confirmation: ETT inserted through vocal cords under direct vision,  positive ETCO2 and breath sounds checked- equal and bilateral Secured at: 23 cm Tube secured with: Tape Dental Injury: Teeth and Oropharynx as per pre-operative assessment

## 2011-07-10 MED ORDER — ONDANSETRON HCL 4 MG PO TABS: 4.0000 mg | ORAL_TABLET | Freq: Three times a day (TID) | ORAL | Status: AC | PRN

## 2011-07-10 MED ORDER — POLYETHYLENE GLYCOL 3350 17 G PO PACK: 17.0000 g | PACK | Freq: Every day | ORAL | Status: AC

## 2011-07-10 MED ORDER — OXYCODONE-ACETAMINOPHEN 10-325 MG PO TABS: 1.0000 | ORAL_TABLET | Freq: Four times a day (QID) | ORAL | Status: AC | PRN

## 2011-07-10 MED ORDER — METHOCARBAMOL 500 MG PO TABS: 500.0000 mg | ORAL_TABLET | Freq: Three times a day (TID) | ORAL | Status: AC
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
## 2011-07-10 NOTE — Progress Notes (Signed)
CARE MANAGEMENT NOTE  07/10/2011   Action/Plan:   No Home Health needs identified by therapy.   Anticipated DC Date:  07/10/2011   Anticipated DC Plan:  HOME/SELF CARE      DC Planning Services  CM consult      PAC Choice  NA   Choice offered to / List presented to:  NA   DME arranged  NA      DME agency  NA     HH arranged  NA      Status of service:  Completed, signed off

## 2011-07-10 NOTE — Progress Notes (Signed)
Billy Blevins 35 y.o. 07/09/2011  LUMBAR 5-SACRAL 1 LATERAL DISCORNEATION WITH LEFT SIDE RADICULOPATHY  1 Day Post-Op   Subjective Patient complaints:doing well without problems  Objective Back:  no straight leg raising pain Neuro exam: grossly normal Vascular: peripheral pulses symmetrical Abdomen: abdomen is soft without significant tenderness, masses, organomegaly or guarding Wound: dressing C/D/I  Lab. Results: No results found for this basename:  WBC:2,HGB:2,HCT:2,PLT:2 in the last 72 hours BMET No results found for this basename:  NA:2,K:2,CL:2,CO2:2,GLUCOSE:2,BUN:2,CREATININE:2,CALCIUM:2,CBG:2 in the last 72 hours  No results found for this basename: inr VITALS -------------------------             07/10/11                   0600       -------------------------  BP:         122/62       Pulse:        75         Temp:   98 F (36.7 C)  Resp:         18        -------------------------  Dg Chest 2 View  07/04/2011  *RADIOLOGY REPORT*  Clinical Data: Preoperative respiratory exam.  Lumbar disc protrusion.  CHEST - 2 VIEW  Comparison: 01/18/2010  Findings: Heart size and vascularity are normal.  Chronic peribronchial thickening.  No acute infiltrates or effusions. Slight thoracic scoliosis, stable.  IMPRESSION: Chronic bronchitic changes.  No acute abnormalities.  Original Report Authenticated By: Gwynn Burly, M.D.   Dg Lumbar Spine 2-3 Views  07/09/2011  *RADIOLOGY REPORT*  Clinical Data: Low back pain  LUMBAR SPINE - 2-3 VIEW  Comparison: Plain films 07/04/2011  Findings: Film #1 demonstrates needles directed most closely toward L5 and S1.  Film #2 demonstrates an angled probe directed most closely toward the L5 pedicle.  Film #3 demonstrates a straight probe directed most closely toward the L5-S1 interspace.  IMPRESSION: L5-S1 marked.  Original Report Authenticated By: Elsie Stain, M.D.   Dg Lumbar Spine 2-3 Views  07/04/2011  *RADIOLOGY  REPORT*  Clinical Data: Preop lumbar laminectomy.  Low back pain.  LUMBAR SPINE - 2-3 VIEW  Comparison:  None.  Findings:  There is no evidence of lumbar spine fracture. Alignment is normal.  Intervertebral disc spaces are maintained. Films are numbered in accordance with five lumbar type vertebral bodies.  IMPRESSION: Negative.  Original Report Authenticated By: Elsie Stain, M.D.     Assessment/ Plan Patient: Doing well postoperatively. Plan: Encourage ambulation & incentive spirometer Radicular leg pain resolved Ok for d/c to home  Disposition:   Discharge condition: Good Discharge destination: Home Plan for follow-up: 1: in 2 weeks    2: instructions provided (pre-printed)     3: scripts in chart    Billy Blevins D 3/21/201312:51 PM
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
## 2011-07-11 ENCOUNTER — Encounter (HOSPITAL_COMMUNITY): Payer: Self-pay | Admitting: Orthopedic Surgery

## 2011-07-29 ENCOUNTER — Other Ambulatory Visit: Payer: Self-pay | Admitting: *Deleted

## 2011-07-29 MED ORDER — OMEPRAZOLE 20 MG PO CPDR: 20.0000 mg | DELAYED_RELEASE_CAPSULE | Freq: Every day | ORAL | Status: DC

## 2011-07-29 MED ORDER — SIMVASTATIN 20 MG PO TABS: 20.0000 mg | ORAL_TABLET | Freq: Every day | ORAL | Status: DC

## 2011-07-29 NOTE — Telephone Encounter (Signed)
What is this for ? , am I the px doctor?

## 2011-07-30 NOTE — Telephone Encounter (Signed)
Spoke with patient and he does not need Prednisone.  It was a mistake on his part, please disregard.

## 2011-08-04 ENCOUNTER — Other Ambulatory Visit: Payer: Self-pay | Admitting: *Deleted

## 2011-08-04 NOTE — Telephone Encounter (Signed)
I do not think this is a long term medication- I put form in the IN box

## 2011-08-04 NOTE — Telephone Encounter (Signed)
Received faxed refill request from pharmacy for Prednisone. Is it okay to refill medication. Form is in your in box.

## 2011-08-05 NOTE — Telephone Encounter (Signed)
Faxed Rx refill request back to [facility] with no refills and that this Rx is not a long term Rx.

## 2011-10-10 ENCOUNTER — Other Ambulatory Visit: Payer: Self-pay | Admitting: *Deleted

## 2011-10-10 MED ORDER — CETIRIZINE-PSEUDOEPHEDRINE ER 5-120 MG PO TB12: 1.0000 | ORAL_TABLET | Freq: Two times a day (BID) | ORAL | Status: DC

## 2011-10-10 MED ORDER — CETIRIZINE-PSEUDOEPHEDRINE ER 5-120 MG PO TB12: 1.0000 | ORAL_TABLET | Freq: Two times a day (BID) | ORAL | Status: AC

## 2011-10-10 NOTE — Telephone Encounter (Signed)
H/o seasonal allergies.  However h/o tachycardia plz notify should use sparingly as D component (decongestant) can raise blood pressure and heart rate. Will send in 1 mo supply of lower dose zyrtec D bid. Will route to PCP as fyi.

## 2011-10-10 NOTE — Telephone Encounter (Signed)
Pt is requesting script for cetirizine- D.  This isnt on med list.  Faxed request is from [address].

## 2011-10-10 NOTE — Telephone Encounter (Signed)
Patient advised.

## 2011-10-16 IMAGING — CR DG THORACIC SPINE 2V
3 series · 3 of 3 positions shown · non-contrast
Comparison: None available

CLINICAL DATA: log versus head, back pain

THORACIC SPINE - 2 VIEW

[t t-spine a.p.]
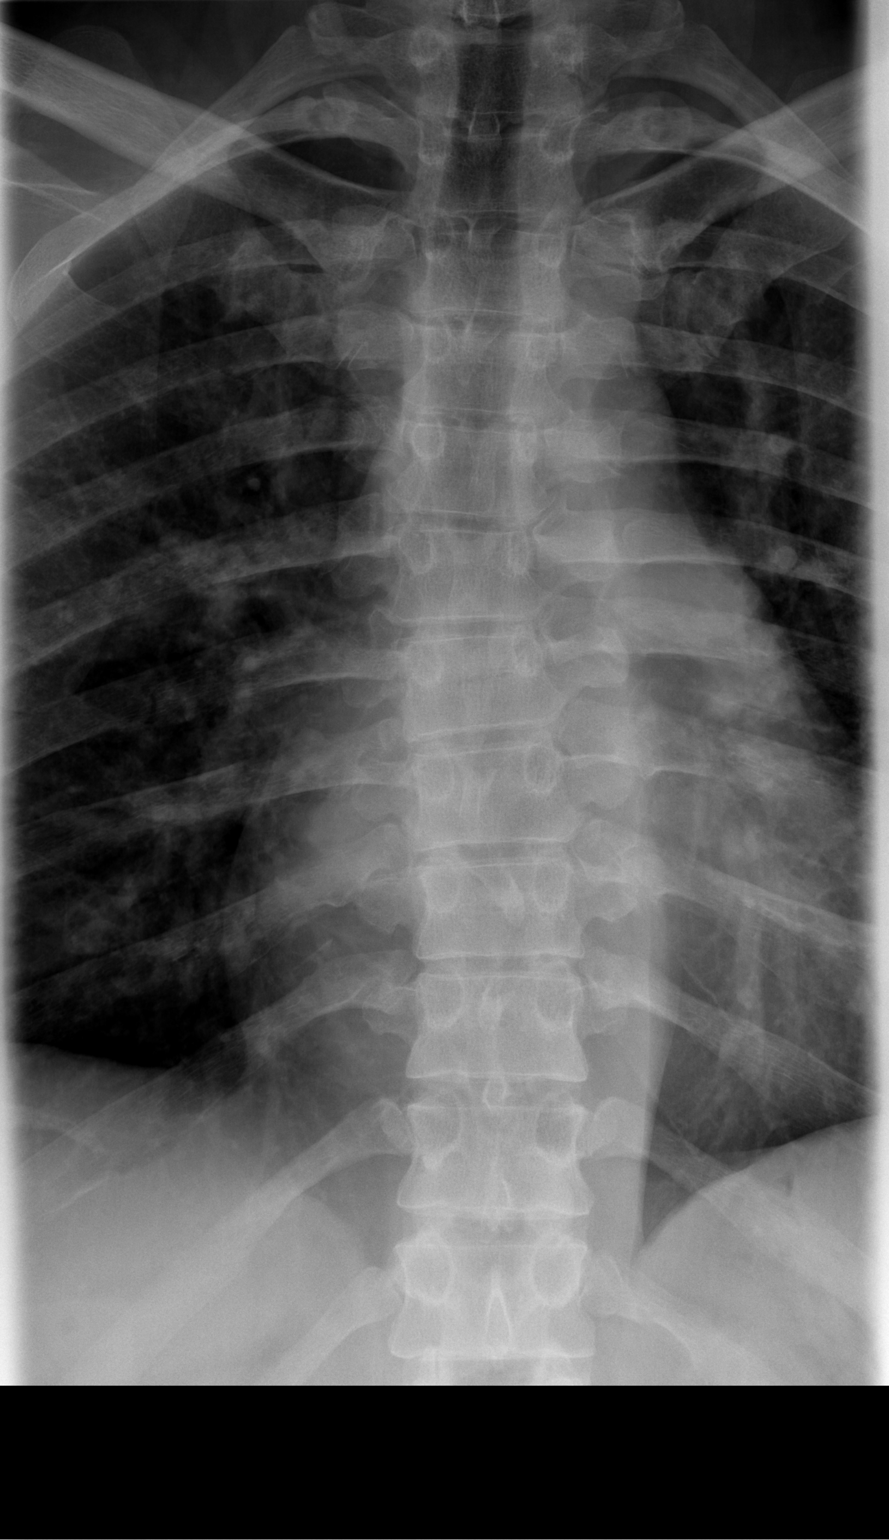

[t t-spine lat]
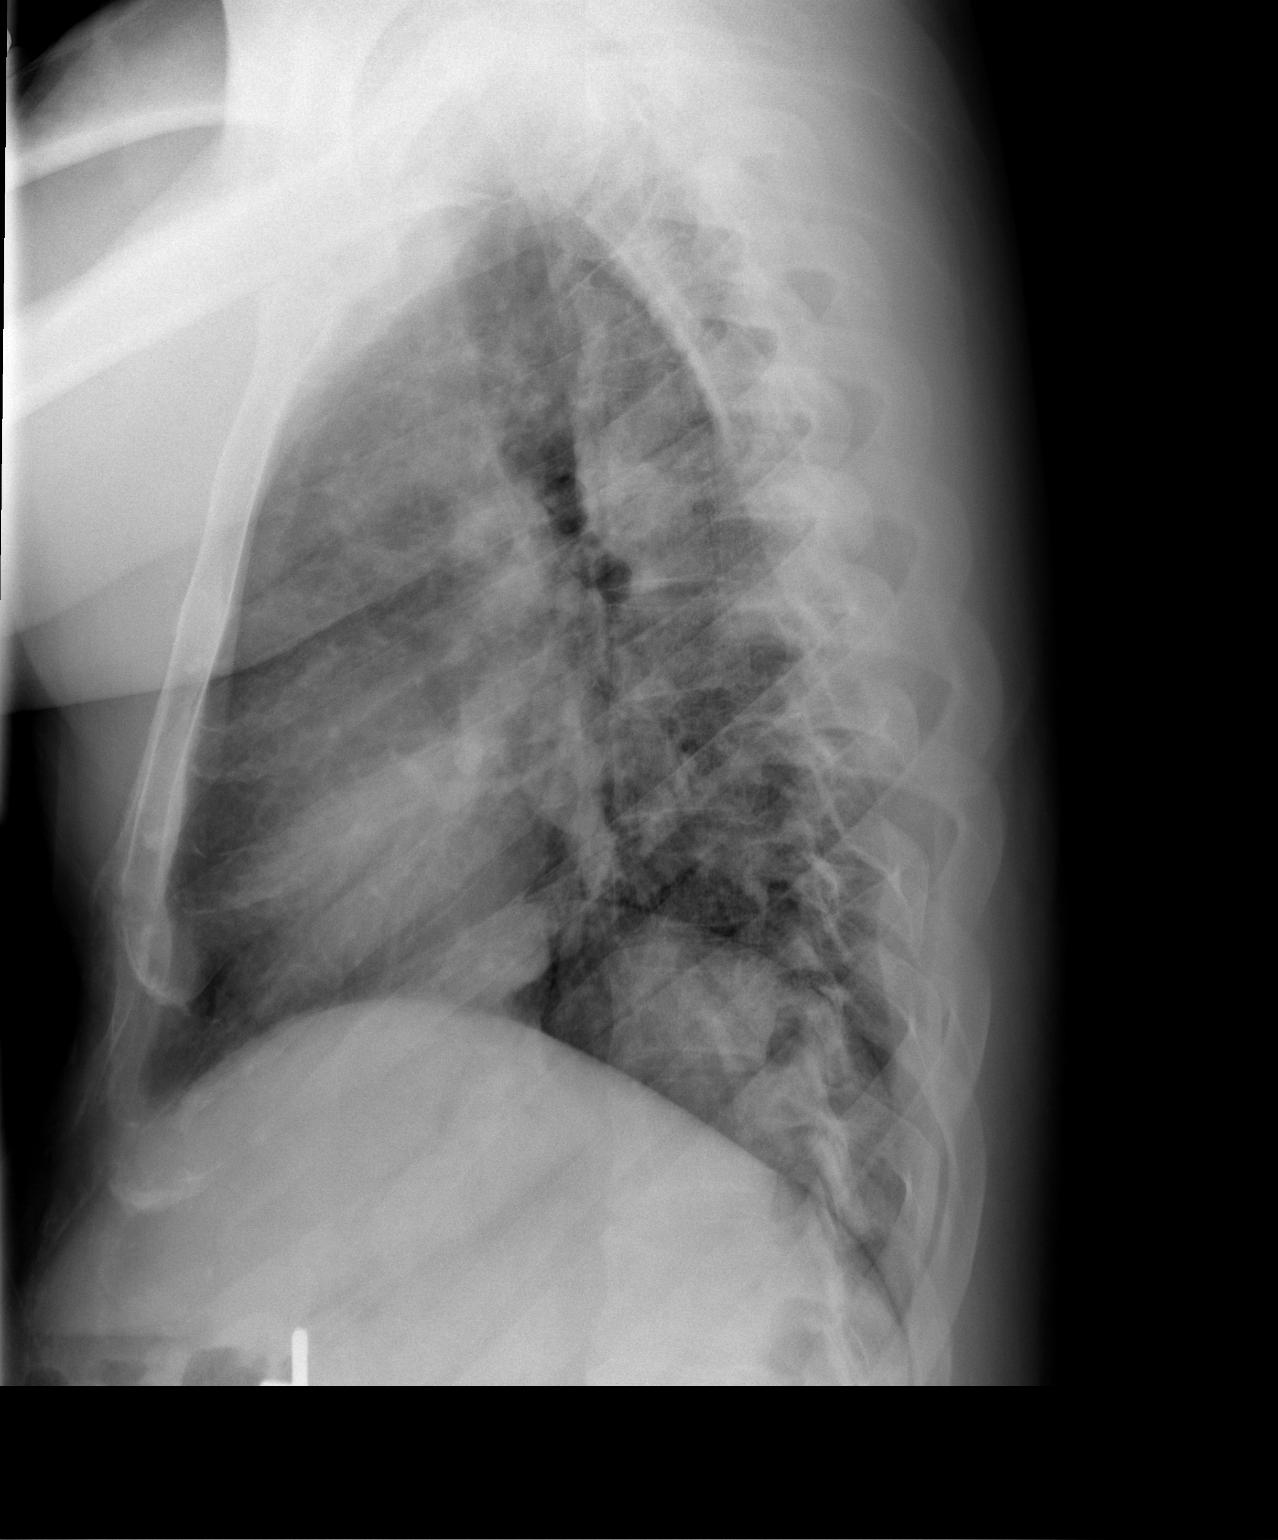

[t swimmers *]
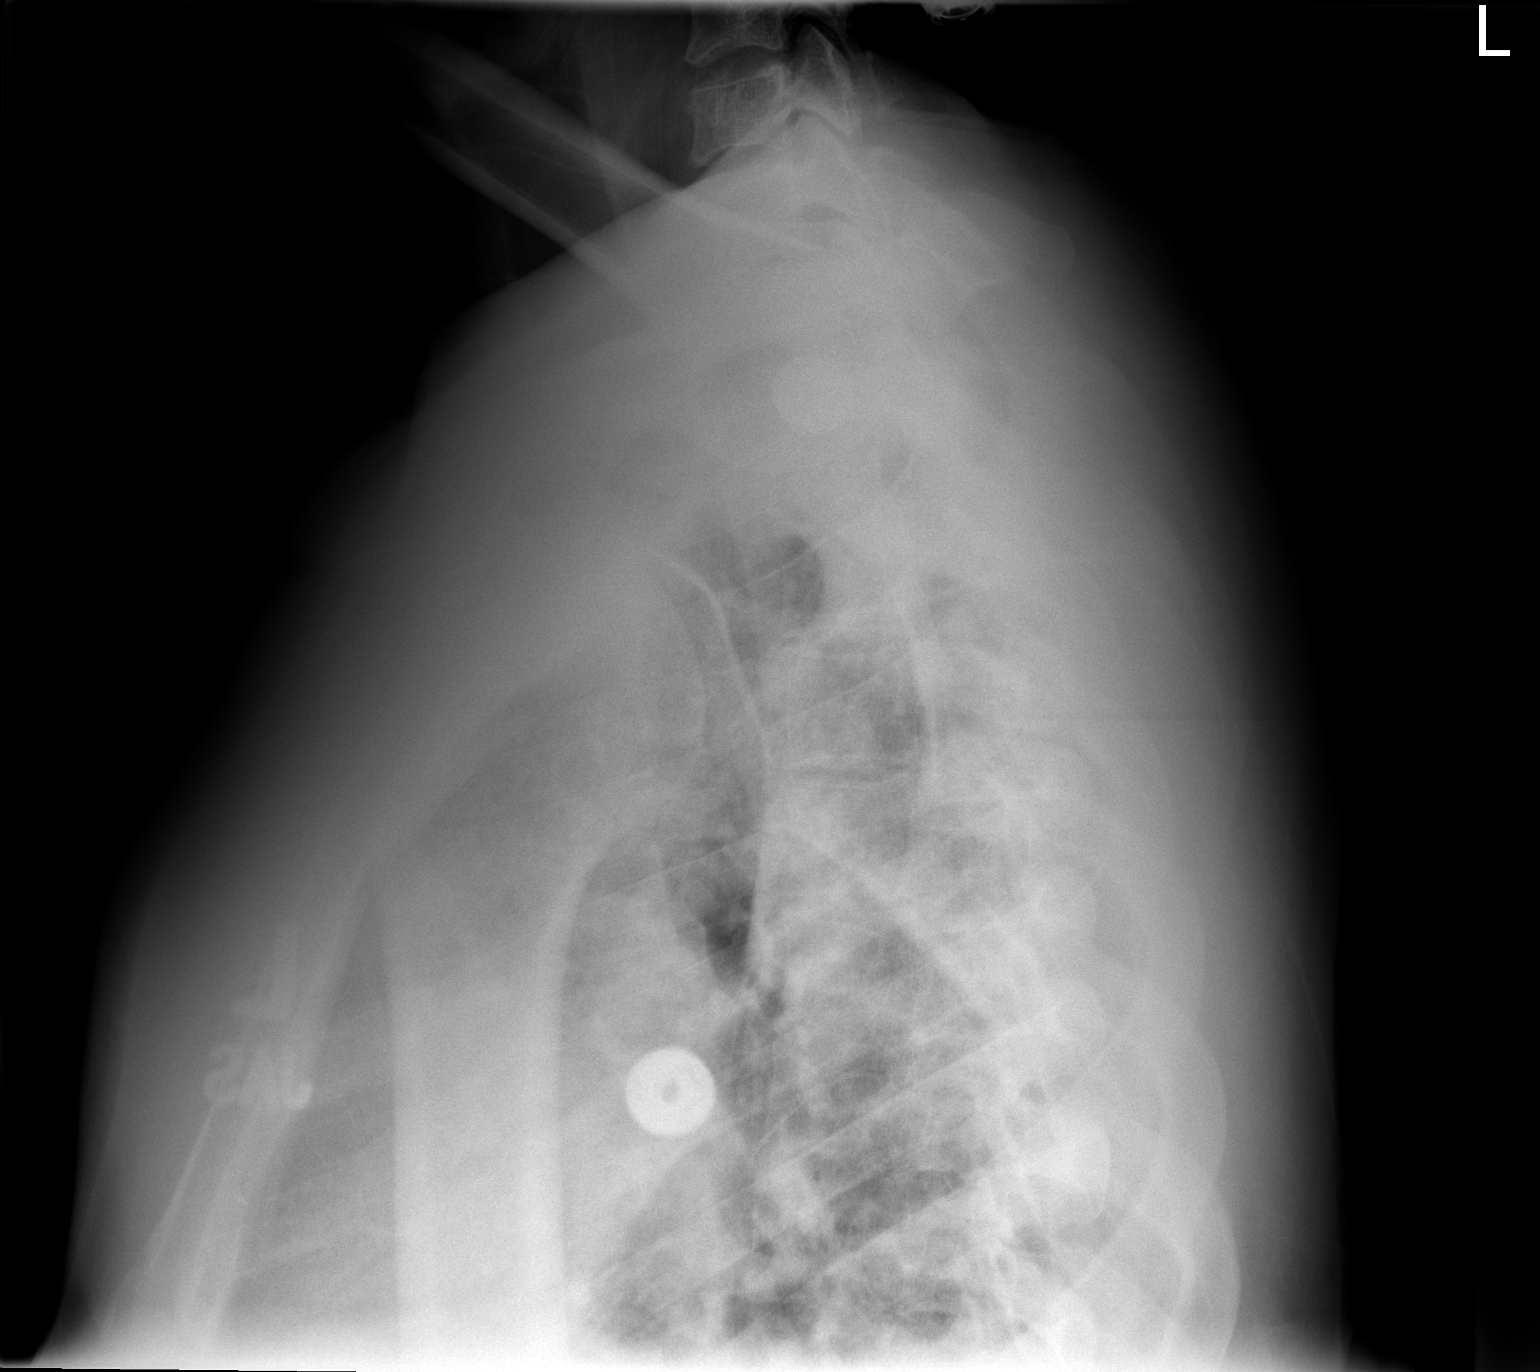

[3 of 3 positions shown; findings below may reference images not displayed]

FINDINGS: There is a mild S-shaped scoliosis of the thoracic spine.
No evidence of underlying vertebral anomaly.  The vertebral body
and disc heights appear maintained.  No significant bony
degenerative changes.
IMPRESSION: Mild thoracic scoliosis without fracture or other acute bony
abnormality.

## 2012-01-20 ENCOUNTER — Encounter: Payer: Self-pay | Admitting: Family Medicine

## 2012-01-20 ENCOUNTER — Ambulatory Visit (INDEPENDENT_AMBULATORY_CARE_PROVIDER_SITE_OTHER): Payer: BC Managed Care – PPO | Admitting: Family Medicine

## 2012-01-20 VITALS — BP 116/80 | HR 90 | Temp 98.3°F | Wt 211.8 lb

## 2012-01-20 DIAGNOSIS — J069 Acute upper respiratory infection, unspecified: Secondary | ICD-10-CM | POA: Insufficient documentation

## 2012-01-20 MED ORDER — GUAIFENESIN-CODEINE 100-10 MG/5ML PO SYRP: 5.0000 mL | ORAL_SOLUTION | Freq: Two times a day (BID) | ORAL | Status: DC | PRN

## 2012-01-20 MED ORDER — FLUTICASONE PROPIONATE 50 MCG/ACT NA SUSP: 2.0000 | Freq: Every day | NASAL | Status: DC

## 2012-01-20 MED ORDER — AZITHROMYCIN 250 MG PO TABS: ORAL_TABLET | ORAL | Status: DC

## 2012-01-20 NOTE — Patient Instructions (Signed)
Sounds like you have an upper respiratory infection. Likely viral.  Viral infections usually take 7-10 days to resolve.  The cough can last several weeks to go away. Use medication as prescribed: cheratussin, flonase. Use simple mucinex with plenty of water to mobilize mucous. Zyrtec D has pseudophed in it so don't take together with red pill. Push fluids and plenty of rest. If fever >101.5 or worsening productive cough or prolonged symptoms past 10 days, fill zpack. Call clinic with questions.  Good to see you today.
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
## 2012-01-20 NOTE — Assessment & Plan Note (Signed)
Given short duration, anticipate viral infection.  See pt instructions for treatment plan. Red flags to seek care discussed. Given h/o PNA, provided with script for zpack to fill only if worsening, or fever >101 or prolonged sxs past 10 days. rec return for PNA and flu shots when feeling better.

## 2012-08-13 ENCOUNTER — Other Ambulatory Visit: Payer: Self-pay | Admitting: *Deleted

## 2012-08-13 MED ORDER — OMEPRAZOLE 20 MG PO CPDR: 20.0000 mg | DELAYED_RELEASE_CAPSULE | Freq: Every day | ORAL | Status: DC

## 2013-01-13 ENCOUNTER — Other Ambulatory Visit: Payer: Self-pay | Admitting: *Deleted

## 2013-01-13 MED ORDER — OMEPRAZOLE 20 MG PO CPDR: 20.0000 mg | DELAYED_RELEASE_CAPSULE | Freq: Every day | ORAL | Status: DC

## 2013-01-13 NOTE — Telephone Encounter (Signed)
Mailed pt a letter to schedule cpx for additional refills.

## 2013-05-16 ENCOUNTER — Telehealth: Payer: Self-pay | Admitting: Family Medicine

## 2013-05-16 DIAGNOSIS — Z0000 Encounter for general adult medical examination without abnormal findings: Secondary | ICD-10-CM

## 2013-05-16 DIAGNOSIS — E785 Hyperlipidemia, unspecified: Secondary | ICD-10-CM

## 2013-05-16 NOTE — Telephone Encounter (Signed)
Message copied by Abner Greenspan on Mon May 16, 2013  9:48 PM ------      Message from: Ellamae Sia      Created: Mon May 09, 2013 12:37 PM      Regarding: Lab orders for Tuesday, 1.27.15       Patient is scheduled for CPX labs, please order future labs, Thanks , Terri       ------

## 2013-05-17 ENCOUNTER — Other Ambulatory Visit (INDEPENDENT_AMBULATORY_CARE_PROVIDER_SITE_OTHER): Payer: BC Managed Care – PPO

## 2013-05-17 DIAGNOSIS — Z0000 Encounter for general adult medical examination without abnormal findings: Secondary | ICD-10-CM

## 2013-05-17 DIAGNOSIS — R7401 Elevation of levels of liver transaminase levels: Secondary | ICD-10-CM

## 2013-05-17 DIAGNOSIS — E785 Hyperlipidemia, unspecified: Secondary | ICD-10-CM

## 2013-05-17 DIAGNOSIS — N289 Disorder of kidney and ureter, unspecified: Secondary | ICD-10-CM

## 2013-05-17 DIAGNOSIS — R740 Nonspecific elevation of levels of transaminase and lactic acid dehydrogenase [LDH]: Secondary | ICD-10-CM

## 2013-05-17 DIAGNOSIS — R7402 Elevation of levels of lactic acid dehydrogenase (LDH): Secondary | ICD-10-CM

## 2013-05-17 LAB — COMPREHENSIVE METABOLIC PANEL
ALK PHOS: 51 U/L (ref 39–117)
ALT: 23 U/L (ref 0–53)
AST: 21 U/L (ref 0–37)
Albumin: 4.2 g/dL (ref 3.5–5.2)
BILIRUBIN TOTAL: 0.9 mg/dL (ref 0.3–1.2)
BUN: 13 mg/dL (ref 6–23)
CO2: 29 meq/L (ref 19–32)
CREATININE: 1.1 mg/dL (ref 0.4–1.5)
Calcium: 9.2 mg/dL (ref 8.4–10.5)
Chloride: 107 mEq/L (ref 96–112)
GFR: 79.69 mL/min (ref >60.00)
GLUCOSE: 102 mg/dL — AB (ref 70–99)
Potassium: 4.1 mEq/L (ref 3.5–5.1)
SODIUM: 141 meq/L (ref 135–145)
TOTAL PROTEIN: 6.9 g/dL (ref 6.0–8.3)

## 2013-05-17 LAB — TSH: TSH: 2.2 u[IU]/mL (ref 0.35–5.50)

## 2013-05-17 LAB — CBC WITH DIFFERENTIAL/PLATELET
Basophils Absolute: 0 10*3/uL (ref 0.0–0.1)
Basophils Relative: 0.4 % (ref 0.0–3.0)
EOS PCT: 2.2 % (ref 0.0–5.0)
Eosinophils Absolute: 0.1 10*3/uL (ref 0.0–0.7)
HEMATOCRIT: 45.4 % (ref 39.0–52.0)
HEMOGLOBIN: 15.7 g/dL (ref 13.0–17.0)
LYMPHS ABS: 2.2 10*3/uL (ref 0.7–4.0)
Lymphocytes Relative: 34.2 % (ref 12.0–46.0)
MCHC: 34.6 g/dL (ref 30.0–36.0)
MCV: 85.1 fl (ref 78.0–100.0)
MONO ABS: 0.5 10*3/uL (ref 0.1–1.0)
Monocytes Relative: 7.1 % (ref 3.0–12.0)
NEUTROS ABS: 3.6 10*3/uL (ref 1.4–7.7)
Neutrophils Relative %: 56.1 % (ref 43.0–77.0)
PLATELETS: 190 10*3/uL (ref 150.0–400.0)
RBC: 5.34 Mil/uL (ref 4.22–5.81)
RDW: 13 % (ref 11.5–14.6)
WBC: 6.4 10*3/uL (ref 4.5–10.5)

## 2013-05-17 LAB — LIPID PANEL
CHOLESTEROL: 190 mg/dL (ref 0–200)
HDL: 38 mg/dL — AB (ref >39.00)
LDL CALC: 136 mg/dL — AB (ref 0–99)
TRIGLYCERIDES: 80 mg/dL (ref 0.0–149.0)
Total CHOL/HDL Ratio: 5
VLDL: 16 mg/dL (ref 0.0–40.0)

## 2013-05-24 ENCOUNTER — Ambulatory Visit (INDEPENDENT_AMBULATORY_CARE_PROVIDER_SITE_OTHER): Payer: BC Managed Care – PPO | Admitting: Family Medicine

## 2013-05-24 ENCOUNTER — Encounter: Payer: Self-pay | Admitting: Family Medicine

## 2013-05-24 VITALS — BP 108/76 | HR 69 | Temp 98.0°F | Ht 69.5 in | Wt 207.0 lb

## 2013-05-24 DIAGNOSIS — E785 Hyperlipidemia, unspecified: Secondary | ICD-10-CM

## 2013-05-24 DIAGNOSIS — J309 Allergic rhinitis, unspecified: Secondary | ICD-10-CM

## 2013-05-24 DIAGNOSIS — Z23 Encounter for immunization: Secondary | ICD-10-CM

## 2013-05-24 DIAGNOSIS — Z0000 Encounter for general adult medical examination without abnormal findings: Secondary | ICD-10-CM

## 2013-05-24 DIAGNOSIS — G5600 Carpal tunnel syndrome, unspecified upper limb: Secondary | ICD-10-CM

## 2013-05-24 MED ORDER — ALBUTEROL SULFATE HFA 108 (90 BASE) MCG/ACT IN AERS: 2.0000 | INHALATION_SPRAY | RESPIRATORY_TRACT | Status: DC | PRN

## 2013-05-24 MED ORDER — SIMVASTATIN 20 MG PO TABS: 20.0000 mg | ORAL_TABLET | Freq: Every day | ORAL | Status: DC

## 2013-05-24 MED ORDER — FLUTICASONE PROPIONATE 50 MCG/ACT NA SUSP: 2.0000 | Freq: Every day | NASAL | Status: AC

## 2013-05-24 MED ORDER — OMEPRAZOLE 20 MG PO CPDR: 20.0000 mg | DELAYED_RELEASE_CAPSULE | Freq: Every day | ORAL | Status: DC

## 2013-05-24 NOTE — Assessment & Plan Note (Signed)
Reviewed health habits including diet and exercise and skin cancer prevention Reviewed appropriate screening tests for age  Also reviewed health mt list, fam hx and immunization status , as well as social and family history   Pneumovax today-in light of pneumonia in the past Labs reviewed

## 2013-05-24 NOTE — Assessment & Plan Note (Signed)
Worse due to dust and pet dander lately Refill flonase/ has been out of that  Continue allegra Update if no improved

## 2013-05-24 NOTE — Assessment & Plan Note (Signed)
Cholesterol is up from more fried foods lately Disc goals for lipids and reasons to control them Rev labs with pt Rev low sat fat diet in detail Handout on low fast diet
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
## 2013-05-24 NOTE — Progress Notes (Signed)
Pre-visit discussion using our clinic review tool. No additional management support is needed unless otherwise documented below in the visit note.  

## 2013-05-24 NOTE — Patient Instructions (Signed)
Pneumonia vaccine today  I refilled medicines including flonase ([facility]) Stop at check out for referral to hand specialist referral -- for carpal tunnel  Work on low cholesterol diet  Minimize sweets also    Fat and Cholesterol Control Diet Fat and cholesterol levels in your blood and organs are influenced by your diet. High levels of fat and cholesterol may lead to diseases of the heart, small and large blood vessels, gallbladder, liver, and pancreas. CONTROLLING FAT AND CHOLESTEROL WITH DIET Although exercise and lifestyle factors are important, your diet is key. That is because certain foods are known to raise cholesterol and others to lower it. The goal is to balance foods for their effect on cholesterol and more importantly, to replace saturated and trans fat with other types of fat, such as monounsaturated fat, polyunsaturated fat, and omega-3 fatty acids. On average, a person should consume no more than 15 to 17 g of saturated fat daily. Saturated and trans fats are considered "bad" fats, and they will raise LDL cholesterol. Saturated fats are primarily found in animal products such as meats, butter, and cream. However, that does not mean you need to give up all your favorite foods. Today, there are good tasting, low-fat, low-cholesterol substitutes for most of the things you like to eat. Choose low-fat or nonfat alternatives. Choose round or loin cuts of red meat. These types of cuts are lowest in fat and cholesterol. Chicken (without the skin), fish, veal, and ground [place] breast are great choices. Eliminate fatty meats, such as hot dogs and salami. Even shellfish have little or no saturated fat. Have a 3 oz (85 g) portion when you eat lean meat, poultry, or fish. Trans fats are also called "partially hydrogenated oils." They are oils that have been scientifically manipulated so that they are solid at room temperature resulting in a longer shelf life and improved taste and texture  of foods in which they are added. Trans fats are found in stick margarine, some tub margarines, cookies, crackers, and baked goods.  When baking and cooking, oils are a great substitute for butter. The monounsaturated oils are especially beneficial since it is believed they lower LDL and raise HDL. The oils you should avoid entirely are saturated tropical oils, such as coconut and palm.  Remember to eat a lot from food groups that are naturally free of saturated and trans fat, including fish, fruit, vegetables, beans, grains (barley, rice, couscous, bulgur wheat), and pasta (without cream sauces).  IDENTIFYING FOODS THAT LOWER FAT AND CHOLESTEROL  Soluble fiber may lower your cholesterol. This type of fiber is found in fruits such as apples, vegetables such as broccoli, potatoes, and carrots, legumes such as beans, peas, and lentils, and grains such as barley. Foods fortified with plant sterols (phytosterol) may also lower cholesterol. You should eat at least 2 g per day of these foods for a cholesterol lowering effect.  Read package labels to identify low-saturated fats, trans fat free, and low-fat foods at the supermarket. Select cheeses that have only 2 to 3 g saturated fat per ounce. Use a heart-healthy tub margarine that is free of trans fats or partially hydrogenated oil. When buying baked goods (cookies, crackers), avoid partially hydrogenated oils. Breads and muffins should be made from whole grains (whole-wheat or whole oat flour, instead of "flour" or "enriched flour"). Buy non-creamy canned soups with reduced salt and no added fats.  FOOD PREPARATION TECHNIQUES  Never deep-fry. If you must fry, either stir-fry, which uses  very little fat, or use non-stick cooking sprays. When possible, broil, bake, or roast meats, and steam vegetables. Instead of putting butter or margarine on vegetables, use lemon and herbs, applesauce, and cinnamon (for squash and sweet potatoes). Use nonfat yogurt, salsa, and  low-fat dressings for salads.  LOW-SATURATED FAT / LOW-FAT FOOD SUBSTITUTES Meats / Saturated Fat (g)  Avoid: Steak, marbled (3 oz/85 g) / 11 g  Choose: Steak, lean (3 oz/85 g) / 4 g  Avoid: Hamburger (3 oz/85 g) / 7 g  Choose: Hamburger, lean (3 oz/85 g) / 5 g  Avoid: Ham (3 oz/85 g) / 6 g  Choose: Ham, lean cut (3 oz/85 g) / 2.4 g  Avoid: Chicken, with skin, dark meat (3 oz/85 g) / 4 g  Choose: Chicken, skin removed, dark meat (3 oz/85 g) / 2 g  Avoid: Chicken, with skin, light meat (3 oz/85 g) / 2.5 g  Choose: Chicken, skin removed, light meat (3 oz/85 g) / 1 g Dairy / Saturated Fat (g)  Avoid: Whole milk (1 cup) / 5 g  Choose: Low-fat milk, 2% (1 cup) / 3 g  Choose: Low-fat milk, 1% (1 cup) / 1.5 g  Choose: Skim milk (1 cup) / 0.3 g  Avoid: Hard cheese (1 oz/28 g) / 6 g  Choose: Skim milk cheese (1 oz/28 g) / 2 to 3 g  Avoid: Cottage cheese, 4% fat (1 cup) / 6.5 g  Choose: Low-fat cottage cheese, 1% fat (1 cup) / 1.5 g  Avoid: Ice cream (1 cup) / 9 g  Choose: Sherbet (1 cup) / 2.5 g  Choose: Nonfat frozen yogurt (1 cup) / 0.3 g  Choose: Frozen fruit bar / trace  Avoid: Whipped cream (1 tbs) / 3.5 g  Choose: Nondairy whipped topping (1 tbs) / 1 g Condiments / Saturated Fat (g)  Avoid: Mayonnaise (1 tbs) / 2 g  Choose: Low-fat mayonnaise (1 tbs) / 1 g  Avoid: Butter (1 tbs) / 7 g  Choose: Extra light margarine (1 tbs) / 1 g  Avoid: Coconut oil (1 tbs) / 11.8 g  Choose: Olive oil (1 tbs) / 1.8 g  Choose: Corn oil (1 tbs) / 1.7 g  Choose: Safflower oil (1 tbs) / 1.2 g  Choose: Sunflower oil (1 tbs) / 1.4 g  Choose: Soybean oil (1 tbs) / 2.4 g  Choose: Canola oil (1 tbs) / 1 g Document Released: 04/07/2005 Document Revised: 08/02/2012 Document Reviewed: 09/26/2010 [employer] Patient Information 2014 [place], [place].

## 2013-05-24 NOTE — Assessment & Plan Note (Signed)
Ref to hand specialist No imp with splinting  Symptoms are more frequent and severe

## 2013-09-19 IMAGING — US US SCROTUM
1 series · 14 of 25 positions shown · non-contrast
Comparison: None

CLINICAL DATA: Pain and swelling.

SCROTAL ULTRASOUND
DOPPLER ULTRASOUND OF THE TESTICLES
TECHNIQUE: Complete ultrasound examination of the testicles,
epididymis, and other scrotal structures was performed.  Color and
spectral Doppler ultrasound were also utilized to evaluate blood
flow to the testicles.

[Series 1: us scrotum · 0.11mm/px · 14 of 45 slices shown]
[im 1/45]
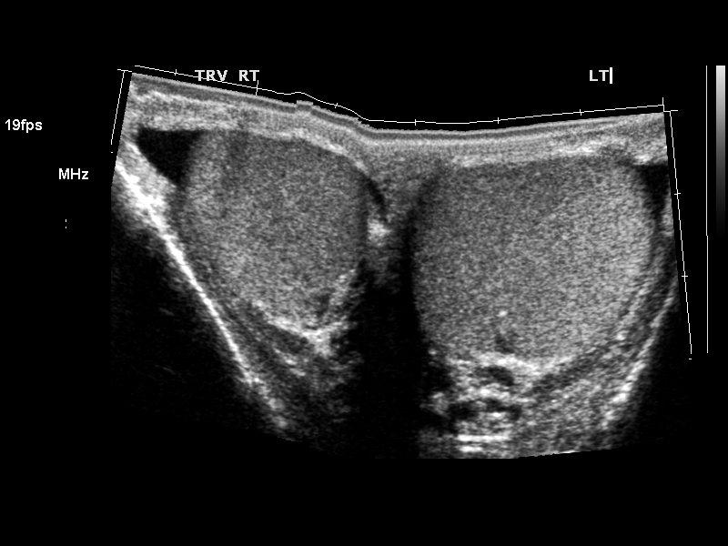
[im 4/45]
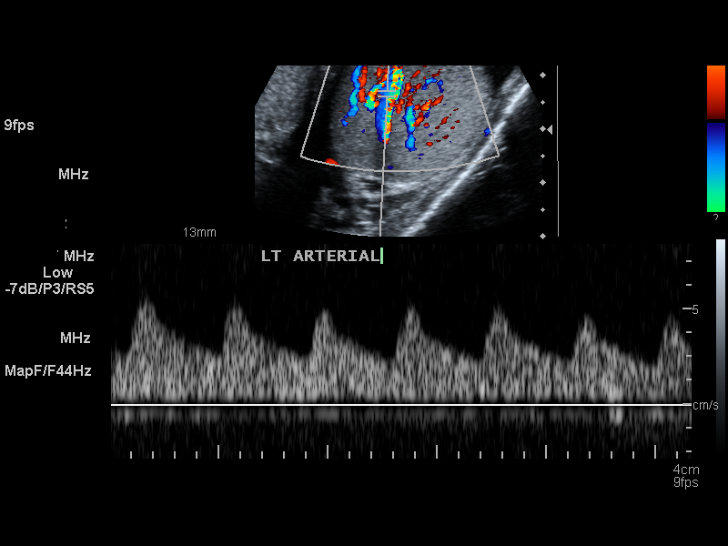
[im 8/45]
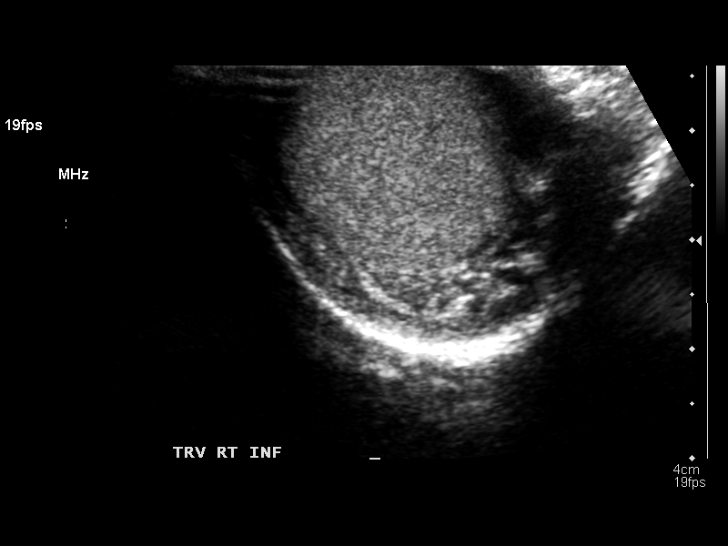
[im 12/45]
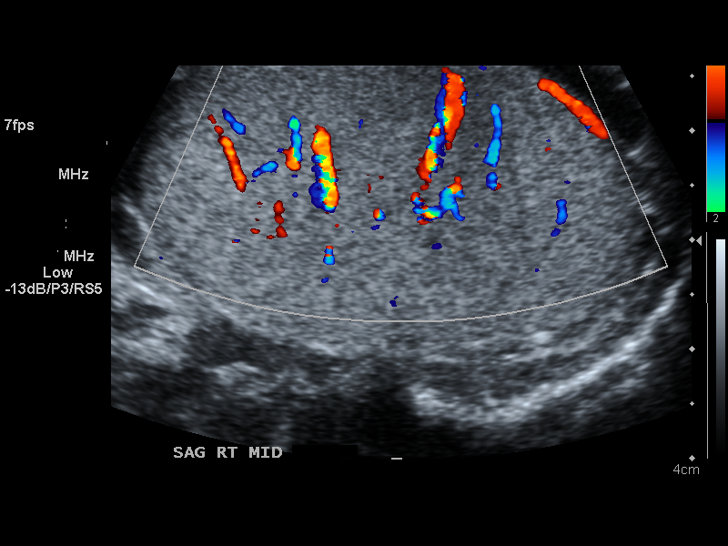
[im 15/45]
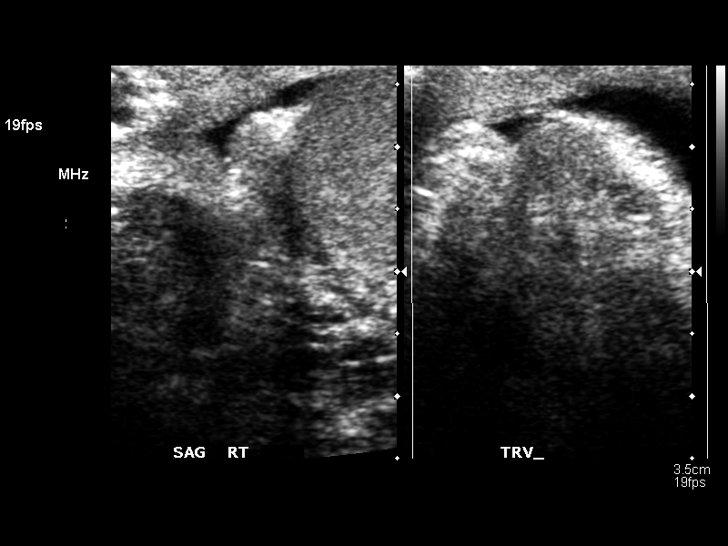
[im 17/45]
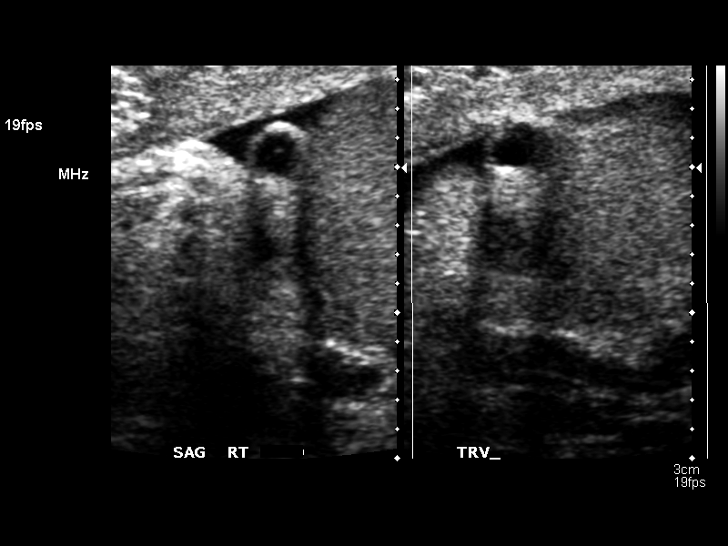
[im 21/45]
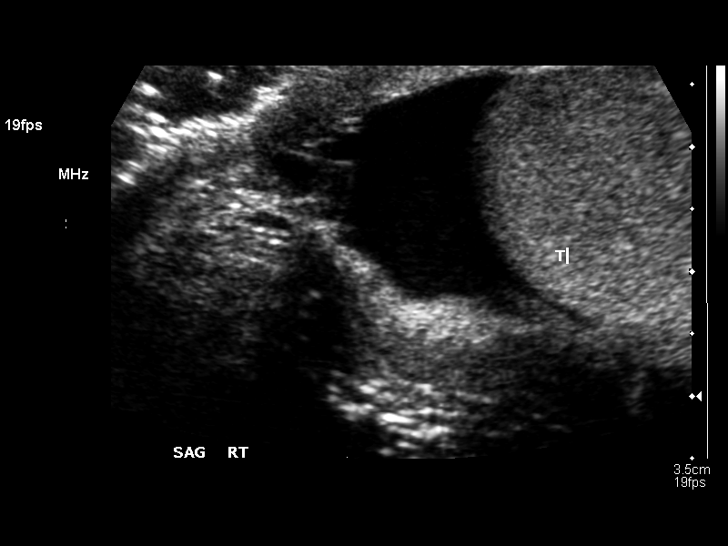
[im 24/45]
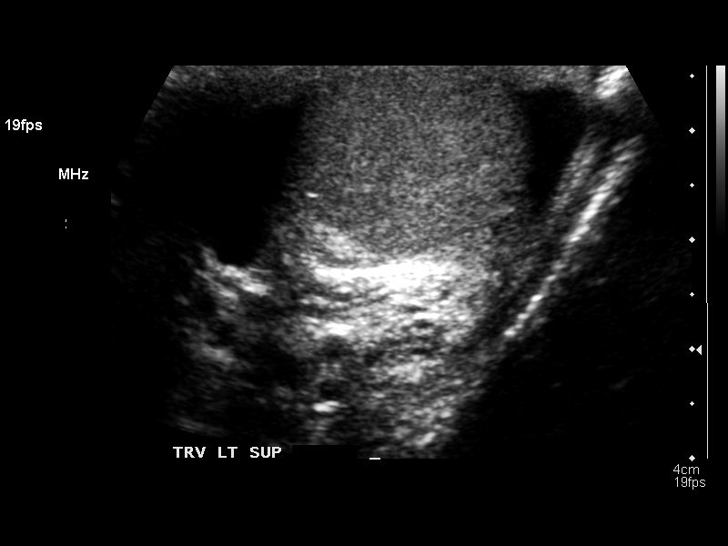
[im 28/45]
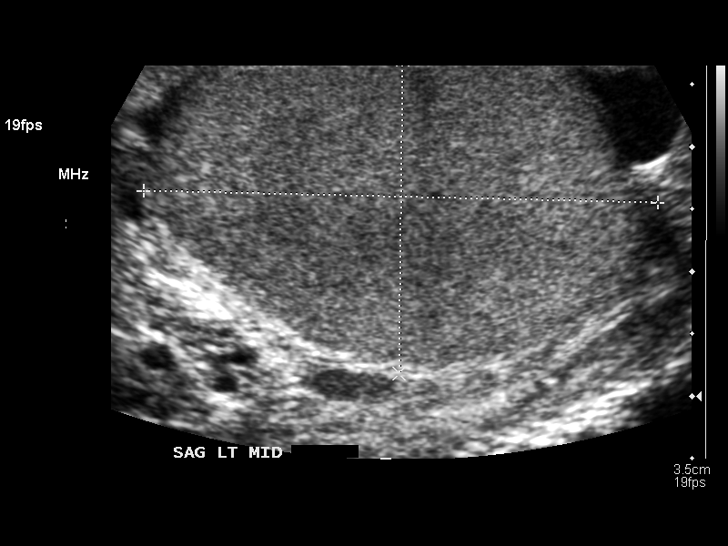
[im 30/45]
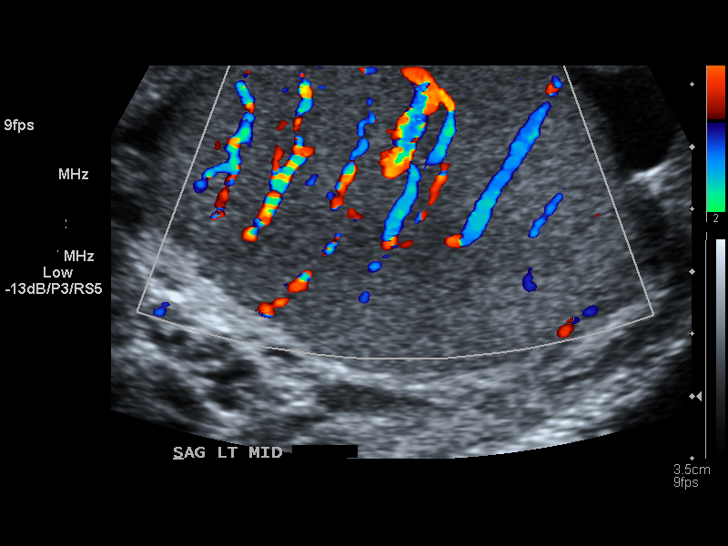
[im 34/45]
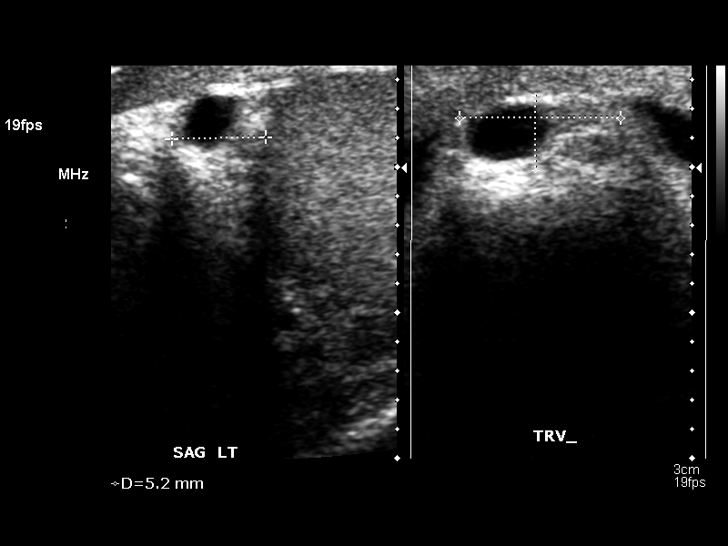
[im 37/45]
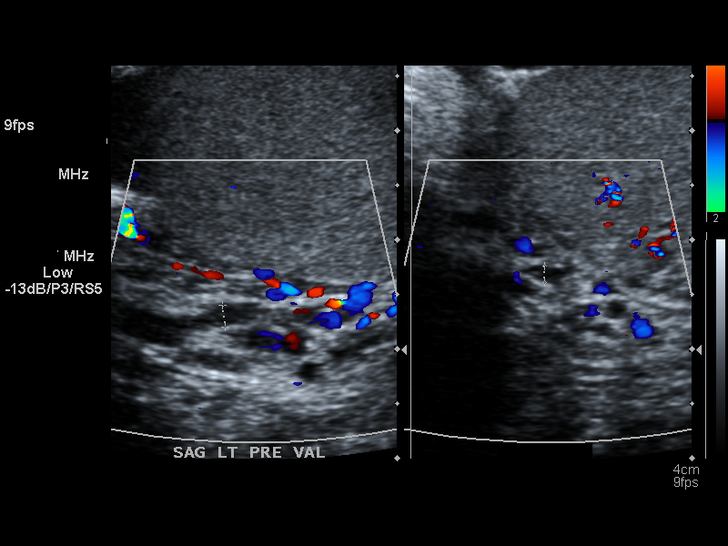
[im 41/45]
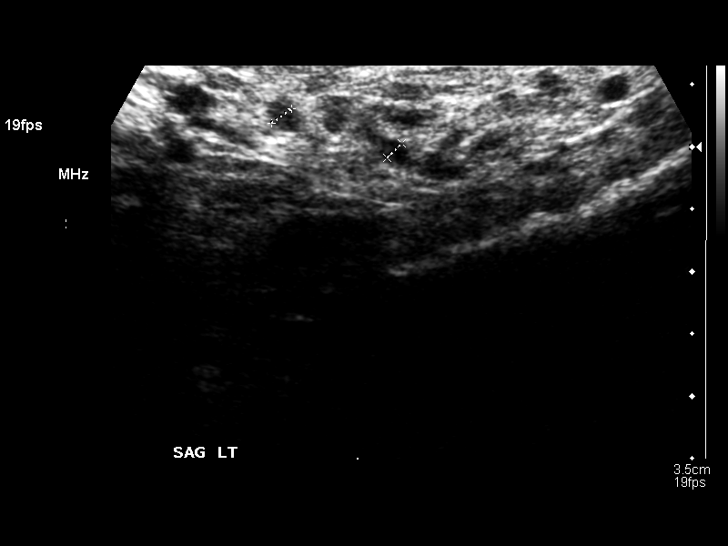
[im 45/45]
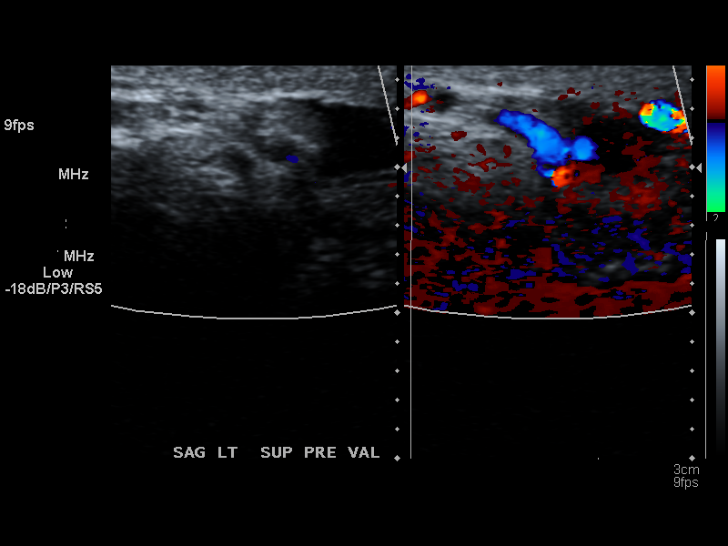

[14 of 25 positions shown; findings below may reference images not displayed]

FINDINGS: Right testis:  Measures 4.6 x 2.5 x 3.0cm.  Normal homogeneous
echogenicity without focal lesions.  Patent intratesticular blood
flow

Left testis:  Measures 4.1 x 2.5 x 3.1 cmcm.  Normal homogeneous
echogenicity without focal lesions.  Patent intratesticular blood
flow

Right epididymis:  Small epididymal cyst measuring 4 mm.

Left epididymis:  Small epididymal cyst measuring 5 mm.

Hydocele:  None

Varicocele:  Varicocele noted on the left.

Pulsed Doppler interrogation of both testes demonstrates low
resistance flow bilaterally.
IMPRESSION: 1.  Normal sonographic appearance of both testicles and patent
intratesticular blood flow.
2.  Small epididymal cysts bilaterally.
3.  Left-sided varicocele.

## 2014-05-26 ENCOUNTER — Other Ambulatory Visit: Payer: Self-pay | Admitting: Family Medicine

## 2014-06-20 ENCOUNTER — Telehealth: Payer: Self-pay | Admitting: *Deleted

## 2014-06-22 MED ORDER — OMEPRAZOLE 20 MG PO CPDR: DELAYED_RELEASE_CAPSULE | ORAL | Status: AC

## 2014-06-22 NOTE — Telephone Encounter (Signed)
Received faxed request for medication refill. Sent rx to [facility].

## 2015-04-30 ENCOUNTER — Other Ambulatory Visit: Payer: Self-pay | Admitting: Family Medicine

## 2015-04-30 NOTE — Telephone Encounter (Signed)
Pt let me know he is no longer our pt Rx declined

## 2015-05-22 ENCOUNTER — Other Ambulatory Visit (INDEPENDENT_AMBULATORY_CARE_PROVIDER_SITE_OTHER): Payer: Self-pay | Admitting: Otolaryngology

## 2015-05-22 DIAGNOSIS — J329 Chronic sinusitis, unspecified: Secondary | ICD-10-CM

## 2015-05-31 ENCOUNTER — Ambulatory Visit
Admission: RE | Admit: 2015-05-31 | Discharge: 2015-05-31 | Disposition: A | Discharge status: Home / Self Care | Payer: PRIVATE HEALTH INSURANCE | Source: Ambulatory Visit | Attending: Otolaryngology | Admitting: Otolaryngology

## 2015-05-31 DIAGNOSIS — J329 Chronic sinusitis, unspecified: Secondary | ICD-10-CM

## 2015-09-10 ENCOUNTER — Other Ambulatory Visit: Payer: Self-pay | Admitting: Urology

## 2015-09-24 ENCOUNTER — Encounter (HOSPITAL_BASED_OUTPATIENT_CLINIC_OR_DEPARTMENT_OTHER): Payer: Self-pay | Admitting: *Deleted

## 2015-09-24 NOTE — Progress Notes (Signed)
NPO AFTER MN.  ARRIVE AT 0730.  NEEDS HG.

## 2015-09-28 ENCOUNTER — Ambulatory Visit (HOSPITAL_BASED_OUTPATIENT_CLINIC_OR_DEPARTMENT_OTHER): Payer: 59 | Admitting: Anesthesiology

## 2015-09-28 ENCOUNTER — Ambulatory Visit (HOSPITAL_BASED_OUTPATIENT_CLINIC_OR_DEPARTMENT_OTHER)
Admission: RE | Admit: 2015-09-28 | Discharge: 2015-09-28 | Disposition: A | Discharge status: Home / Self Care | Payer: 59 | Source: Ambulatory Visit | Attending: Urology | Admitting: Urology

## 2015-09-28 ENCOUNTER — Encounter (HOSPITAL_BASED_OUTPATIENT_CLINIC_OR_DEPARTMENT_OTHER): Payer: Self-pay | Admitting: *Deleted

## 2015-09-28 ENCOUNTER — Encounter (HOSPITAL_BASED_OUTPATIENT_CLINIC_OR_DEPARTMENT_OTHER)
Admission: RE | Disposition: A | Discharge status: Home / Self Care | Payer: Self-pay | Source: Ambulatory Visit | Attending: Urology

## 2015-09-28 DIAGNOSIS — Z7951 Long term (current) use of inhaled steroids: Secondary | ICD-10-CM | POA: Diagnosis not present

## 2015-09-28 DIAGNOSIS — E669 Obesity, unspecified: Secondary | ICD-10-CM | POA: Insufficient documentation

## 2015-09-28 DIAGNOSIS — Z79899 Other long term (current) drug therapy: Secondary | ICD-10-CM | POA: Diagnosis not present

## 2015-09-28 DIAGNOSIS — Z302 Encounter for sterilization: Secondary | ICD-10-CM | POA: Diagnosis not present

## 2015-09-28 DIAGNOSIS — Z6831 Body mass index (BMI) 31.0-31.9, adult: Secondary | ICD-10-CM | POA: Insufficient documentation

## 2015-09-28 DIAGNOSIS — K219 Gastro-esophageal reflux disease without esophagitis: Secondary | ICD-10-CM | POA: Insufficient documentation

## 2015-09-28 DIAGNOSIS — M1990 Unspecified osteoarthritis, unspecified site: Secondary | ICD-10-CM | POA: Diagnosis not present

## 2015-09-28 DIAGNOSIS — E7800 Pure hypercholesterolemia, unspecified: Secondary | ICD-10-CM | POA: Diagnosis not present

## 2015-09-28 DIAGNOSIS — Z87891 Personal history of nicotine dependence: Secondary | ICD-10-CM | POA: Insufficient documentation

## 2015-09-28 HISTORY — DX: Personal history of urinary calculi: Z87.442

## 2015-09-28 HISTORY — PX: VASECTOMY: SHX75

## 2015-09-28 HISTORY — DX: Other seasonal allergic rhinitis: J30.2

## 2015-09-28 HISTORY — DX: Personal history of other (healed) physical injury and trauma: Z87.828

## 2015-09-28 HISTORY — DX: Personal history of other infectious and parasitic diseases: Z86.19

## 2015-09-28 HISTORY — DX: Other specified postprocedural states: Z98.890

## 2015-09-28 HISTORY — DX: Nausea with vomiting, unspecified: R11.2

## 2015-09-28 LAB — POCT HEMOGLOBIN-HEMACUE: Hemoglobin: 15.4 g/dL (ref 13.0–17.0)

## 2015-09-28 SURGERY — VASECTOMY
Anesthesia: General | Site: Scrotum | Laterality: Bilateral

## 2015-09-28 MED ORDER — CEFAZOLIN SODIUM-DEXTROSE 2-4 GM/100ML-% IV SOLN
INTRAVENOUS | Status: AC
  Filled 2015-09-28: qty 100

## 2015-09-28 MED ORDER — LIDOCAINE HCL (CARDIAC) 20 MG/ML IV SOLN
INTRAVENOUS | Status: AC
  Filled 2015-09-28: qty 5

## 2015-09-28 MED ORDER — CEFAZOLIN SODIUM-DEXTROSE 2-4 GM/100ML-% IV SOLN
2.0000 g | INTRAVENOUS | Status: AC
  Administered 2015-09-28: 2 g via INTRAVENOUS
  Filled 2015-09-28: qty 100

## 2015-09-28 MED ORDER — PROPOFOL 10 MG/ML IV BOLUS
INTRAVENOUS | Status: DC | PRN
  Administered 2015-09-28: 200 mg via INTRAVENOUS

## 2015-09-28 MED ORDER — ONDANSETRON HCL 4 MG/2ML IJ SOLN
INTRAMUSCULAR | Status: AC
  Filled 2015-09-28: qty 2

## 2015-09-28 MED ORDER — MIDAZOLAM HCL 2 MG/2ML IJ SOLN
INTRAMUSCULAR | Status: AC
  Filled 2015-09-28: qty 2

## 2015-09-28 MED ORDER — LACTATED RINGERS IV SOLN
INTRAVENOUS | Status: DC
  Administered 2015-09-28: 09:00:00 via INTRAVENOUS
  Filled 2015-09-28: qty 1000

## 2015-09-28 MED ORDER — DEXAMETHASONE SODIUM PHOSPHATE 10 MG/ML IJ SOLN
INTRAMUSCULAR | Status: AC
  Filled 2015-09-28: qty 1

## 2015-09-28 MED ORDER — ONDANSETRON HCL 4 MG/2ML IJ SOLN
INTRAMUSCULAR | Status: DC | PRN
  Administered 2015-09-28: 4 mg via INTRAVENOUS

## 2015-09-28 MED ORDER — LIDOCAINE 2% (20 MG/ML) 5 ML SYRINGE
INTRAMUSCULAR | Status: DC | PRN
  Administered 2015-09-28: 60 mg via INTRAVENOUS

## 2015-09-28 MED ORDER — BUPIVACAINE HCL (PF) 0.25 % IJ SOLN
INTRAMUSCULAR | Status: DC | PRN
Start: 2015-09-28 — End: 2015-09-28
  Administered 2015-09-28: 3 mL

## 2015-09-28 MED ORDER — BUPIVACAINE HCL (PF) 0.5 % IJ SOLN
10.0000 mL | Freq: Once | INTRAMUSCULAR | Status: DC
  Filled 2015-09-28: qty 10

## 2015-09-28 MED ORDER — FENTANYL CITRATE (PF) 100 MCG/2ML IJ SOLN
INTRAMUSCULAR | Status: AC
  Filled 2015-09-28: qty 2

## 2015-09-28 MED ORDER — FENTANYL CITRATE (PF) 100 MCG/2ML IJ SOLN
INTRAMUSCULAR | Status: DC | PRN
  Administered 2015-09-28: 50 ug via INTRAVENOUS
  Administered 2015-09-28: 25 ug via INTRAVENOUS

## 2015-09-28 MED ORDER — PROPOFOL 10 MG/ML IV BOLUS
INTRAVENOUS | Status: AC
  Filled 2015-09-28: qty 20

## 2015-09-28 MED ORDER — MELOXICAM 15 MG PO TABS: 15.0000 mg | ORAL_TABLET | Freq: Every day | ORAL | Status: AC

## 2015-09-28 MED ORDER — TRAMADOL-ACETAMINOPHEN 37.5-325 MG PO TABS: 1.0000 | ORAL_TABLET | Freq: Four times a day (QID) | ORAL | Status: DC | PRN

## 2015-09-28 MED ORDER — DEXAMETHASONE SODIUM PHOSPHATE 4 MG/ML IJ SOLN
INTRAMUSCULAR | Status: DC | PRN
  Administered 2015-09-28: 10 mg via INTRAVENOUS

## 2015-09-28 MED ORDER — ACETAMINOPHEN 500 MG PO TABS
1000.0000 mg | ORAL_TABLET | Freq: Four times a day (QID) | ORAL | Status: DC | PRN
  Administered 2015-09-28: 1000 mg via ORAL
  Filled 2015-09-28: qty 2

## 2015-09-28 MED ORDER — MIDAZOLAM HCL 5 MG/5ML IJ SOLN
INTRAMUSCULAR | Status: DC | PRN
  Administered 2015-09-28: 2 mg via INTRAVENOUS

## 2015-09-28 MED ORDER — KETOROLAC TROMETHAMINE 30 MG/ML IJ SOLN
30.0000 mg | Freq: Once | INTRAMUSCULAR | Status: DC
  Filled 2015-09-28: qty 1

## 2015-09-28 MED ORDER — ACETAMINOPHEN 500 MG PO TABS
ORAL_TABLET | ORAL | Status: AC
  Filled 2015-09-28: qty 2

## 2015-09-28 SURGICAL SUPPLY — 40 items
APPLICATOR COTTON TIP 6IN STRL (MISCELLANEOUS) IMPLANT
BLADE CLIPPER SURG (BLADE) IMPLANT
BLADE SURG 15 STRL LF DISP TIS (BLADE) ×1 IMPLANT
BLADE SURG 15 STRL SS (BLADE) ×3
BNDG GAUZE ELAST 4 BULKY (GAUZE/BANDAGES/DRESSINGS) ×3 IMPLANT
CLEANER CAUTERY TIP 5X5 PAD (MISCELLANEOUS) ×1 IMPLANT
CLOTH BEACON ORANGE TIMEOUT ST (SAFETY) IMPLANT
COVER BACK TABLE 60X90IN (DRAPES) ×3 IMPLANT
COVER MAYO STAND STRL (DRAPES) ×3 IMPLANT
DRAPE LAPAROTOMY 100X72 PEDS (DRAPES) ×3 IMPLANT
ELECT NDL TIP 2.8 STRL (NEEDLE) ×1 IMPLANT
ELECT NEEDLE TIP 2.8 STRL (NEEDLE) ×3 IMPLANT
ELECT REM PT RETURN 9FT ADLT (ELECTROSURGICAL) ×3
ELECTRODE REM PT RTRN 9FT ADLT (ELECTROSURGICAL) ×1 IMPLANT
GLOVE BIO SURGEON STRL SZ7.5 (GLOVE) ×3 IMPLANT
GLOVE BIOGEL PI IND STRL 7.0 (GLOVE) IMPLANT
GLOVE BIOGEL PI IND STRL 7.5 (GLOVE) IMPLANT
GLOVE BIOGEL PI INDICATOR 7.0 (GLOVE) ×2
GLOVE BIOGEL PI INDICATOR 7.5 (GLOVE) ×6
GLOVE SURG SS PI 7.0 STRL IVOR (GLOVE) ×3 IMPLANT
GOWN STRL REUS W/TWL LRG LVL4 (GOWN DISPOSABLE) ×4 IMPLANT
GOWN STRL REUS W/TWL XL LVL3 (GOWN DISPOSABLE) ×2 IMPLANT
GOWN W/2 COTTON TOWELS 2 STD (GOWNS) ×3 IMPLANT
KIT ROOM TURNOVER WOR (KITS) ×3 IMPLANT
LIQUID BAND (GAUZE/BANDAGES/DRESSINGS) ×3 IMPLANT
MANIFOLD NEPTUNE II (INSTRUMENTS) IMPLANT
NEEDLE HYPO 25X5/8 SAFETYGLIDE (NEEDLE) ×3 IMPLANT
NS IRRIG 500ML POUR BTL (IV SOLUTION) ×2 IMPLANT
PACK BASIN DAY SURGERY FS (CUSTOM PROCEDURE TRAY) ×3 IMPLANT
PAD CLEANER CAUTERY TIP 5X5 (MISCELLANEOUS) ×2
PENCIL BUTTON HOLSTER BLD 10FT (ELECTRODE) ×3 IMPLANT
SUT MON AB 5-0 PS2 18 (SUTURE) ×5 IMPLANT
SUT VIC AB 3-0 SH 27 (SUTURE) ×3
SUT VIC AB 3-0 SH 27X BRD (SUTURE) ×1 IMPLANT
SYR CONTROL 10ML LL (SYRINGE) ×3 IMPLANT
TOWEL OR 17X24 6PK STRL BLUE (TOWEL DISPOSABLE) ×5 IMPLANT
TRAY DSU PREP LF (CUSTOM PROCEDURE TRAY) ×3 IMPLANT
TUBE CONNECTING 12'X1/4 (SUCTIONS)
TUBE CONNECTING 12X1/4 (SUCTIONS) IMPLANT
WATER STERILE IRR 500ML POUR (IV SOLUTION) IMPLANT

## 2015-09-28 NOTE — Transfer of Care (Signed)
Immediate Anesthesia Transfer of Care Note  Patient: Billy Blevins  Procedure(s) Performed: Procedure(s) (LRB): VASECTOMY (Bilateral)  Patient Location: PACU  Anesthesia Type: General  Level of Consciousness: awake, oriented, sedated and patient cooperative  Airway & Oxygen Therapy: Patient Spontanous Breathing and Patient connected to face mask oxygen  Post-op Assessment: Report given to PACU RN and Post -op Vital signs reviewed and stable  Post vital signs: Reviewed and stable  Complications: No apparent anesthesia complications

## 2015-09-28 NOTE — Anesthesia Preprocedure Evaluation (Addendum)
Anesthesia Evaluation  Patient identified by MRN, date of birth, ID band Patient awake    Reviewed: Allergy & Precautions, NPO status , Patient's Chart, lab work & pertinent test results  History of Anesthesia Complications (+) PONV and history of anesthetic complications  Airway Mallampati: II  TM Distance: >3 FB Neck ROM: Full    Dental  (+) Teeth Intact, Dental Advisory Given   Pulmonary former smoker,    Pulmonary exam normal breath sounds clear to auscultation       Cardiovascular Normal cardiovascular exam Rhythm:Regular Rate:Normal  HLD   Neuro/Psych negative neurological ROS  negative psych ROS   GI/Hepatic Neg liver ROS, GERD  Medicated and Controlled,  Endo/Other  Obesity   Renal/GU negative Renal ROS     Musculoskeletal  (+) Arthritis , Osteoarthritis,    Abdominal   Peds  Hematology negative hematology ROS (+)   Anesthesia Other Findings Day of surgery medications reviewed with the patient.  Reproductive/Obstetrics                            Anesthesia Physical Anesthesia Plan  ASA: II  Anesthesia Plan: General   Post-op Pain Management:    Induction: Intravenous  Airway Management Planned: LMA  Additional Equipment:   Intra-op Plan:   Post-operative Plan: Extubation in OR  Informed Consent: I have reviewed the patients History and Physical, chart, labs and discussed the procedure including the risks, benefits and alternatives for the proposed anesthesia with the patient or authorized representative who has indicated his/her understanding and acceptance.   Dental advisory given  Plan Discussed with: CRNA  Anesthesia Plan Comments: (Risks/benefits of general anesthesia discussed with patient including risk of damage to teeth, lips, gum, and tongue, nausea/vomiting, allergic reactions to medications, and the possibility of heart attack, stroke and death.  All  patient questions answered.  Patient wishes to proceed.)        Anesthesia Quick Evaluation

## 2015-09-28 NOTE — Discharge Instructions (Addendum)
Vasectomy A vasectomy is tying (with or without cutting) the tube that collects the sperm from the testicle (vas deferens). The vasectomy blocks the sperm from going through the vas deferens and penis so that during sexual intercourse, the sperm does not go into the vagina. Vasectomy is safe, with very rare complications. It does not affect your sexual desire or performance. A vasectomy does not prevent sexually transmitted diseases. Because vasectomy is considered permanent, you should not have it done until you are sure you do not want any more children. You and your partner should be in full agreement to have the procedure. Your decision to have a vasectomy should not be made during a stressful situation. This includes loss of a pregnancy, illness, death of a spouse, or divorce. There are other means of contraception that can be used until you are completely sure you want this procedure done.  LET [facility] CARE PROVIDER KNOW ABOUT:   Any allergies you have.  All medicines you are taking, including vitamins, herbs, eye drops, creams, and over-the-counter medicines.  Previous problems you or members of your family have had with the use of anesthetics.  Any blood disorders you have.  Previous surgeries you have had.  Medical conditions you have. RISKS AND COMPLICATIONS Generally, vasectomy is a safe procedure. However, as with any procedure, complications can occur. Possible complications include:  Failure of the procedure to cause infertility. This means you would still be able to get a male pregnant. Even after sterilization has been achieved, there is a 1 in 10,000 chance that the two cut ends may reconnect (recanalization).  Infection. A germ starts growing in the wound. This can usually be treated with antibiotic medicine(s).  An allergic reaction to the anesthetic or other medicine given.  Bleeding. Blood may seep under the skin so that the scrotum and penis appear to be bruised.  Sometimes the scrotum can swell and get the size of a grapefruit. This usually disappears without treatment within a week or two. BEFORE THE PROCEDURE  Do not take aspirin or aspirin-containing products for 7 days prior to your procedure.  Do not take nonsteroidal anti-inflammatory products for 7 days prior to your procedure.  You may be instructed to wash with soap before coming in for your procedure. PROCEDURE  The scrotum is cleaned with bacteria-killing soap, and the health care provider finds the vas deferens.  Each side of the scrotum is numbed.  A very small cut (incision) is made, and the vas deferens are pulled out of the scrotum. The vas deferens are then tied off, cut, or may be burned (cauterized) at the ends.  Sometimes the vas deferens are pulled out from the scrotum through a puncture wound. This is done with a special instrument without an incision.  The vas deferens are then put back into the scrotum, and the incision or puncture wound is closed. Absorbable suture material that will dissolve and not need to be removed is commonly used.  After surgery, sperm may still be left in the vas deferens for 1-3 months. Because of this, other means of contraception should be used until your health care provider examines you and finds there are no sperm in your seminal fluid. AFTER THE PROCEDURE After the procedure, you will be taken to the recovery area. Your progress will be watched and checked. Once you are awake, stable, and taking fluids well, you will be allowed to go home as long as there are no problems.    This  information is not intended to replace advice given to you by your health care provider. Make sure you discuss any questions you have with your health care provider.   Document Released: 06/28/2002 Document Revised: 04/12/2013 Document Reviewed: 10/25/2012 Elsevier Interactive Patient Education 2016 [facility]  Instructions  Activity: Get plenty of rest for the remainder of the day. A responsible adult should stay with you for 24 hours following the procedure.  For the next 24 hours, DO NOT: -Drive a car -Paediatric nurse -Drink alcoholic beverages -Take any medication unless instructed by your physician -Make any legal decisions or sign important papers.  Meals: Start with liquid foods such as gelatin or soup. Progress to regular foods as tolerated. Avoid greasy, spicy, heavy foods. If nausea and/or vomiting occur, drink only clear liquids until the nausea and/or vomiting subsides. Call your physician if vomiting continues.  Special Instructions/Symptoms: Your throat may feel dry or sore from the anesthesia or the breathing tube placed in your throat during surgery. If this causes discomfort, gargle with warm salt water. The discomfort should disappear within 24 hours.

## 2015-09-28 NOTE — Op Note (Signed)
Pre-operative diagnosis :  Elective sterilization  Postoperative diagnosis:  same  Operation: Elective  Vasectomy  Surgeon:  S. Gaynelle Arabian, MD  First assistant:  None  Anesthesia:  Gen. LMA  Preparation:  After appropriate preanesthesia, the patient was brought the operating room, placed on the operating table in the dorsal supine position where general LMA anesthesia was introduced. He remained in this position, where the scrotum was prepped with Betadine solution, and draped in usual fashion. The arm band was double checked. The history was double checked.  Review history:  Problems  1. Counseling in natural family planning to avoid pregnancy (Z30.02)  History of Present Illness    40 YO male, last seen 04/30/11, referred back by Dr. Lavone Neri for a vasectomy consult. He has 2 sons and 1 daughter. He would like to discuss a more permanent form of birth control.    Previous hx of testicular pain. Scrotal US 04/23/11: normal sonographic appearance of both testicles and patent intratesticular blood flow. Small epididymal cysts bilaterally. Left-sided varicocele. Has been treated with Cipro 250 mg 1 po BID X 7 days.   Statement of  Likelihood of Success: Excellent. TIME-OUT observed.:  Procedure:  The vas was identified bilaterally, and was buried within the spermatic cord investing structures bilaterally. A 0.5 cm incision was made bilaterally, and the subcutaneous tissue dissected with sharp and blunt dissection. The vas was dissected, isolated, and doubly clamped. Injection with 0.5% plain Marcaine on the proximal and distal portions of the vas was accomplished. Additional injection was accomplished in the wound and wound edges. A portion of the vas was removed with the electrosurgical unit. Each end of the vas was ligated with 3-0 Vicryl suture.  The wound was closed with 4-0 Monocryl suture in 2 layers. Sterile liqui-band dressing was applied. The patient received preop by mouth Tylenol,  as well as postop IV Toradol. He was awakened and taken to recovery room in good condition.

## 2015-09-28 NOTE — H&P (Signed)
Reason For Visit Vasectomy consult   Active Problems Problems  1. Counseling in natural family planning to avoid pregnancy (Z30.02)  History of Present Illness     40 YO male, last seen 04/30/11, referred back by Dr. Lavone Neri for a vasectomy consult. He has 2 sons and 1 daughter. He would like to discuss a more permanent form of birth control.    Previous hx of testicular pain. Scrotal US 04/23/11: normal sonographic appearance of both testicles and patent intratesticular blood flow. Small epididymal cysts bilaterally. Left-sided varicocele. Has been treated with Cipro 250 mg 1 po BID X 7 days.   Past Medical History Problems  1. History of heartburn (Z87.898) 2. History of hypercholesterolemia (Z86.39)  Surgical History Problems  1. History of Corneal LASIK Bilateral 2. History of Tonsillectomy  Current Meds 1. Cyclobenzaprine HCl - 10 MG Oral Tablet;  Therapy: [id number] to Recorded 2. Diclofenac Sodium 75 MG Oral Tablet Delayed Release;  Therapy: [id number] to Recorded 3. Flonase 50 MCG/ACT Nasal Suspension;  Therapy: (Recorded:02May2017) to Recorded 4. Ginseng TABS;  Therapy: (Recorded:04Mar2011) to Recorded 5. Omeprazole 20 MG Oral Capsule Delayed Release;  Therapy: [id number] to Recorded 6. One-Daily Multi Vitamins Oral Tablet;  Therapy: (Recorded:04Mar2011) to Recorded 7. Zocor TABS;  Therapy: (Recorded:04Mar2011) to Recorded 8. Zyrtec TABS;  Therapy: (Recorded:02May2017) to Recorded  Allergies Medication  1. No Known Drug Allergies  Family History Problems  1. Family history of Cardiac Arrest : Father 2. Family history of Diabetes Mellitus : Father 3. Family history of Heart Disease : Father 4. Family history of Hematuria : Father 5. Family history of Hematuria : Mother 61. Family history of Hypertension : Mother 5. Family history of Nephrolithiasis : Father 44. Family history of Nephrolithiasis : Mother 78. Family history of Parkinson Disease :  Father  Social History Problems    Alcohol Use (History)   Caffeine Use   Marital History - Single   Occupation:   Tobacco Use  Review of Systems Genitourinary, constitutional, skin, eye, otolaryngeal, hematologic/lymphatic, cardiovascular, pulmonary, endocrine, musculoskeletal, gastrointestinal, neurological and psychiatric system(s) were reviewed and pertinent findings if present are noted and are otherwise negative.  Genitourinary: nocturia.  Musculoskeletal: back pain and joint pain.    Vitals Vital Signs [Data Includes: Last 1 Day]  Recorded: [id number] 08:51AM  Height: 5 ft 10 in Weight: 215 lb  BMI Calculated: 30.85 BSA Calculated: 2.15 Blood Pressure: 146 / 92 Temperature: 98.7 F Heart Rate: 77  Physical Exam Constitutional: Well nourished and well developed . No acute distress.  ENT:. The ears and nose are normal in appearance.  Neck: The appearance of the neck is normal and no neck mass is present.  Pulmonary: No respiratory distress and normal respiratory rhythm and effort.  Cardiovascular: Heart rate and rhythm are normal . No peripheral edema.  Abdomen: The abdomen is soft and nontender. No masses are palpated. No CVA tenderness. No hernias are palpable. No hepatosplenomegaly noted.  Genitourinary: Examination of the penis demonstrates no discharge, no masses, no lesions and a normal meatus. The scrotum is without lesions. The right epididymis is palpably normal and non-tender. The left epididymis is palpably normal and non-tender. The right testis is palpably normal, non-tender and without masses. The left testis is normal, non-tender and without masses. Tight scrotum.  Lymphatics: The femoral and inguinal nodes are not enlarged or tender.  Skin: Normal skin turgor, no visible rash and no visible skin lesions.  Neuro/Psych:. Mood and affect are appropriate.    Assessment Assessed  1.  Counseling in natural family planning to avoid pregnancy (Z30.02)  Pt is  good candidate for vasectomy, but needs anesthesia b/c he has a tight scrotum.   Plan Vasectomy under anesthesia.   Signatures Electronically signed by : Carolan Clines, M.D.; Aug 21 2015  9:48AM EST

## 2015-09-28 NOTE — Anesthesia Procedure Notes (Signed)
Procedure Name: LMA Insertion Date/Time: 09/28/2015 9:37 AM Performed by: Denna Haggard D Pre-anesthesia Checklist: Patient identified, Emergency Drugs available, Suction available and Patient being monitored Patient Re-evaluated:Patient Re-evaluated prior to inductionOxygen Delivery Method: Circle system utilized Preoxygenation: Pre-oxygenation with 100% oxygen Intubation Type: IV induction Ventilation: Mask ventilation without difficulty LMA: LMA inserted LMA Size: 4.0 Number of attempts: 1 Airway Equipment and Method: Bite block Placement Confirmation: positive ETCO2 Tube secured with: Tape Dental Injury: Teeth and Oropharynx as per pre-operative assessment

## 2015-09-28 NOTE — Interval H&P Note (Signed)
History and Physical Interval Note:  09/28/2015 9:35 AM  Billy Blevins  has presented today for surgery, with the diagnosis of ELECTIVE STERILIZATION  The various methods of treatment have been discussed with the patient and family. After consideration of risks, benefits and other options for treatment, the patient has consented to  Procedure(s): VASECTOMY (N/A) as a surgical intervention .  The patient's history has been reviewed, patient examined, no change in status, stable for surgery.  I have reviewed the patient's chart and labs.  Questions were answered to the patient's satisfaction.     Chanice Brenton I Shawndell Schillaci

## 2015-10-01 ENCOUNTER — Encounter (HOSPITAL_BASED_OUTPATIENT_CLINIC_OR_DEPARTMENT_OTHER): Payer: Self-pay | Admitting: Urology

## 2015-10-01 NOTE — Anesthesia Postprocedure Evaluation (Signed)
Anesthesia Post Note  Patient: Billy Blevins  Procedure(s) Performed: Procedure(s) (LRB): VASECTOMY (Bilateral)  Patient location during evaluation: PACU Anesthesia Type: General Level of consciousness: awake and alert Pain management: pain level controlled Vital Signs Assessment: post-procedure vital signs reviewed and stable Respiratory status: spontaneous breathing, nonlabored ventilation, respiratory function stable and patient connected to nasal cannula oxygen Cardiovascular status: blood pressure returned to baseline and stable Postop Assessment: no signs of nausea or vomiting Anesthetic complications: no    Last Vitals:  Filed Vitals:   09/28/15 1100 09/28/15 1134  BP: 128/94 129/86  Pulse: 67 65  Temp:  36.5 C  Resp: 17 16    Last Pain: There were no vitals filed for this visit.               Catalina Gravel

## 2015-12-13 DIAGNOSIS — I10 Essential (primary) hypertension: Secondary | ICD-10-CM | POA: Insufficient documentation

## 2015-12-13 DIAGNOSIS — E7800 Pure hypercholesterolemia, unspecified: Secondary | ICD-10-CM | POA: Insufficient documentation

## 2016-01-10 DIAGNOSIS — G8929 Other chronic pain: Secondary | ICD-10-CM | POA: Insufficient documentation

## 2016-01-23 ENCOUNTER — Emergency Department (HOSPITAL_COMMUNITY): Payer: 59

## 2016-01-23 ENCOUNTER — Emergency Department (HOSPITAL_COMMUNITY)
Admission: EM | Admit: 2016-01-23 | Discharge: 2016-01-23 | Disposition: A | Discharge status: Home / Self Care | Payer: 59 | Attending: Emergency Medicine | Admitting: Emergency Medicine

## 2016-01-23 ENCOUNTER — Encounter (HOSPITAL_COMMUNITY): Payer: Self-pay

## 2016-01-23 DIAGNOSIS — M545 Low back pain, unspecified: Secondary | ICD-10-CM

## 2016-01-23 DIAGNOSIS — Z79899 Other long term (current) drug therapy: Secondary | ICD-10-CM | POA: Diagnosis not present

## 2016-01-23 DIAGNOSIS — Z87891 Personal history of nicotine dependence: Secondary | ICD-10-CM | POA: Insufficient documentation

## 2016-01-23 MED ORDER — OXYCODONE-ACETAMINOPHEN 5-325 MG PO TABS: 1.0000 | ORAL_TABLET | ORAL | 0 refills | Status: DC | PRN

## 2016-01-23 MED ORDER — OXYCODONE-ACETAMINOPHEN 5-325 MG PO TABS
1.0000 | ORAL_TABLET | Freq: Once | ORAL | Status: AC
  Administered 2016-01-23: 1 via ORAL
  Filled 2016-01-23: qty 1

## 2016-01-23 MED ORDER — DIAZEPAM 5 MG PO TABS: 5.0000 mg | ORAL_TABLET | Freq: Two times a day (BID) | ORAL | 0 refills | Status: DC

## 2016-01-23 MED ORDER — DIAZEPAM 5 MG PO TABS
5.0000 mg | ORAL_TABLET | Freq: Once | ORAL | Status: AC
  Administered 2016-01-23: 5 mg via ORAL
  Filled 2016-01-23: qty 1

## 2016-01-23 NOTE — ED Notes (Signed)
PT DISCHARGED. INSTRUCTIONS AND PRESCRIPTIONS GIVEN. AAOX4. PT IN NO APPARENT DISTRESS. THE OPPORTUNITY TO ASK QUESTIONS WAS PROVIDED. 

## 2016-01-23 NOTE — Discharge Instructions (Signed)
Take your medications as prescribed as needed for pain relief. I recommend applying ice and/or heat to affected area for 15 minutes 3-4 times daily to help with pain. I recommend resting and refraining from doing any strenuous activity or heavy lifting for the next few days and today your symptoms have improved. Follow-up with your orthopedist tomorrow at your scheduled appointment for further evaluation of your back pain. Please return to the Emergency Department if symptoms worsen or new onset of fever, numbness, tingling, groin anesthesia, loss of bowel or bladder, weakness, urinary symptoms.

## 2016-01-23 NOTE — ED Provider Notes (Signed)
[facility] DEPT Provider Note   CSN: [id number] Arrival date & time: 01/23/16  [id number]  By signing my name below, I, Rayna Sexton, attest that this documentation has been prepared under the direction and in the presence of Nona Dell, [place]. Electronically Signed: Rayna Sexton, ED Scribe. 01/23/16. 1:27 PM.   History   Chief Complaint Chief Complaint  Patient presents with  . Back Pain    HPI HPI Comments: Billy Blevins is a 40 y.o. male with a h/o lumbar degenerative disk disease and L5/S1 discectomy in 2013 who presents to the Emergency Department complaining of moderate, non-radiating, midline lower back pain onset four days ago and worsening beginning last night. He states that he has a h/o back pain in the affected region and he initially slipped and fell off a dock, landing on his feet, 4 days ago causing his pain to begin once again and then worsened last night s/p coughing. His pain worsens with bending, palpation and when moving to a standing position. He has taken meloxicam and flexeril and denies significant relief and this morning took one oxycodone at 9:00 am which provided mild relief. His oxycodone was rx years ago from a prior surgery. Pt has an appointment with his orthopaedist tomorrow. Pt denies fever, numbness, tingling, saddle anesthesia, loss of bowel or bladder, urinary sxs, weakness, IVDU, cancer or recent spinal manipulation.   Orthopaedist: Dr. Rolena Infante at [facility].   The history is provided by the patient. No language interpreter was used.    Past Medical History:  Diagnosis Date  . Allergic rhinitis, seasonal   . Chronic back pain    degenerative   . GERD (gastroesophageal reflux disease)   . History of head injury    TREE FALL ON HEAD  . History of kidney stones   . History of mononucleosis    2009  . Hyperactive gag reflex   . Hyperlipidemia   . PONV (postoperative nausea and vomiting)    after eye sx as child     Patient Active Problem List   Diagnosis Date Noted  . Routine general medical examination at a health care facility 05/16/2013  . Lumbar degenerative disc disease 05/28/2011  . Tachycardia 05/28/2011  . CARPAL TUNNEL SYNDROME 08/17/2008  . RHINITIS 08/17/2008  . HYPERLIPIDEMIA 02/14/2008  . G E R D 01/01/2007    Past Surgical History:  Procedure Laterality Date  . ADENOIDECTOMY  as child  . LUMBAR LAMINECTOMY/DECOMPRESSION MICRODISCECTOMY  07/09/2011   Procedure: LUMBAR LAMINECTOMY/DECOMPRESSION MICRODISCECTOMY;  Surgeon: Melina Schools, MD;  Location: [facility];  Service: Orthopedics;  Laterality: N/A;  LEFT L5-S1 DISCECTOMY  . STRABISMUS SURGERY Left as child  . TONSILLECTOMY  09-09-2010  . VASECTOMY Bilateral 09/28/2015   Procedure: VASECTOMY;  Surgeon: Carolan Clines, MD;  Location: [facility];  Service: Urology;  Laterality: Bilateral;     Home Medications    Prior to Admission medications   Medication Sig Start Date End Date Taking? Authorizing Provider  acetaminophen (TYLENOL) 500 MG tablet Take 500 mg by mouth every 6 (six) hours as needed.    Historical Provider, MD  cetirizine (ZYRTEC) 10 MG tablet Take 10 mg by mouth at bedtime.    Historical Provider, MD  diazepam (VALIUM) 5 MG tablet Take 1 tablet (5 mg total) by mouth 2 (two) times daily. 01/23/16   Nona Dell, PA-C  fluticasone [facility]) 50 MCG/ACT nasal spray Place 2 sprays into both nostrils daily. 05/24/13   Abner Greenspan, MD  ibuprofen (ADVIL,MOTRIN) 200 MG tablet Take 200 mg by mouth every 6 (six) hours as needed.    Historical Provider, MD  meloxicam (MOBIC) 15 MG tablet Take 1 tablet (15 mg total) by mouth daily. 09/28/15   Carolan Clines, MD  Multiple Vitamin (MULTIVITAMIN) tablet Take 1 tablet by mouth daily.      Historical Provider, MD  Naproxen Sodium (ALEVE) 220 MG CAPS Take by mouth.    Historical Provider, MD  omeprazole (PRILOSEC) 20 MG capsule TAKE 1 BY MOUTH  DAILY Patient taking differently: Take 20 mg by mouth every evening. TAKE 1 BY MOUTH DAILY 06/22/14   Abner Greenspan, MD  oxyCODONE-acetaminophen (PERCOCET/ROXICET) 5-325 MG tablet Take 1 tablet by mouth every 4 (four) hours as needed for severe pain. 01/23/16   Nona Dell, PA-C  simvastatin (ZOCOR) 40 MG tablet Take 40 mg by mouth every evening.    Historical Provider, MD  traMADol-acetaminophen (ULTRACET) 37.5-325 MG tablet Take 1 tablet by mouth every 6 (six) hours as needed for moderate pain. 09/28/15   Carolan Clines, MD    Family History Family History  Problem Relation Age of Onset  . Hyperlipidemia Mother     diet controlled  . Heart disease Father     CAD/ MI x 2  . Diabetes Father   . Parkinsonism Father   . Anesthesia problems Neg Hx   . Hypotension Neg Hx   . Malignant hyperthermia Neg Hx   . Pseudochol deficiency Neg Hx     Social History Social History  Substance Use Topics  . Smoking status: Former Smoker    Years: 12.00    Types: Cigarettes    Quit date: 09/24/2006  . Smokeless tobacco: Never Used     Comment: used vapors from 2008 until Sept 2016  . Alcohol use Not on file     Allergies   Review of patient's allergies indicates no known allergies.   Review of Systems Review of Systems  Constitutional: Negative for chills and fever.  Respiratory: Negative for shortness of breath.   Cardiovascular: Negative for chest pain.  Gastrointestinal: Negative for abdominal pain, constipation, diarrhea, nausea and vomiting.  Musculoskeletal: Positive for back pain.  Neurological: Negative for weakness and numbness.   Physical Exam Updated Vital Signs BP 120/79 (BP Location: Right Arm)   Pulse 82   Temp 97.9 F (36.6 C) (Oral)   Resp 17   Ht 5\' 9"  (1.753 m)   Wt 91.6 kg   SpO2 98%   BMI 29.83 kg/m   Physical Exam  Constitutional: He is oriented to person, place, and time. He appears well-developed and well-nourished. No distress.  HENT:   Head: Normocephalic and atraumatic.  Eyes: Conjunctivae and EOM are normal. Right eye exhibits no discharge. Left eye exhibits no discharge. No scleral icterus.  Neck: Normal range of motion. Neck supple.  Cardiovascular: Normal rate, regular rhythm, normal heart sounds and intact distal pulses.   Pulmonary/Chest: Effort normal and breath sounds normal. No respiratory distress. He has no wheezes. He has no rales. He exhibits no tenderness.  Abdominal: Soft. He exhibits no distension. There is no tenderness.  Musculoskeletal: He exhibits tenderness. He exhibits no edema or deformity.  No midline C or T tenderness. Midline lumbar tenderness noted. Full range of motion of neck and decreased ROM of back due to pain. Full range of motion of bilateral upper and lower extremities, with 5/5 strength. Sensation intact. 2+ radial and PT pulses. Cap refill <2 seconds. Patient able  to stand and ambulate but endorses pain.   Neurological: He is alert and oriented to person, place, and time. He has normal strength and normal reflexes. No sensory deficit. Gait normal.  Skin: Skin is warm and dry. He is not diaphoretic.  Nursing note and vitals reviewed.  ED Treatments / Results  Labs (all labs ordered are listed, but only abnormal results are displayed) Labs Reviewed - No data to display  EKG  EKG Interpretation None       Radiology Dg Lumbar Spine Complete  Result Date: 01/23/2016 CLINICAL DATA:  Chronic lumbago EXAM: LUMBAR SPINE - COMPLETE 4+ VIEW COMPARISON:  July 04, 2011 FINDINGS: Frontal, lateral, spot lumbosacral lateral, and bilateral oblique views were obtained. There are 5 non-rib-bearing lumbar type vertebral bodies. There is no fracture or spondylolisthesis. There is moderate disc space narrowing at L4-5. Other disc spaces appear unremarkable. There is no appreciable facet arthropathy. There are multiple intrarenal calculi bilaterally measuring between 1 and 4 mm in size. IMPRESSION:  Moderate disc space narrowing L4-5. No fracture or spondylolisthesis. Intrarenal calculi noted bilaterally, more on the right than on the left. Electronically Signed   By: Lowella Grip III M.D.   On: 01/23/2016 13:48    Procedures Procedures  DIAGNOSTIC STUDIES: Oxygen Saturation is 94% on RA, adequate by my interpretation.    COORDINATION OF CARE: 1:05 PM Discussed next steps with pt. Pt verbalized understanding and is agreeable with the plan.    Medications Ordered in ED Medications  oxyCODONE-acetaminophen (PERCOCET/ROXICET) 5-325 MG per tablet 1 tablet (1 tablet Oral Given 01/23/16 1258)  diazepam (VALIUM) tablet 5 mg (5 mg Oral Given 01/23/16 1259)     Initial Impression / Assessment and Plan / ED Course  I have reviewed the triage vital signs and the nursing notes.  Pertinent labs & imaging results that were available during my care of the patient were reviewed by me and considered in my medical decision making (see chart for details).  Clinical Course    Patient presented with lower back pain that started 4 days ago after slipping and falling off a dock that was approximately 2-3 feet off the ground. Denies head injury or LOC. History of chronic back pain with L5-S1 discectomy in 2013, followed by Dr. Rolena Infante. VSS. Exam revealed tenderness over lumbar midline spine. Remaining exam unremarkable. No neuro deficits. Patient able to stand and ambulate but endorses pain. Lumbar spine x-ray revealed moderate disc space narrowing at L4-5. No concern for cauda equina.  No fever, night sweats, weight loss, h/o cancer, IVDU. Discussed results and plan for discharge with patient. Plan to discharge patient home with pain meds, muscle relaxant and symptomatic treatment. Advised patient to follow up with his scheduled appointment with his orthopedist this week for follow-up. Discussed strict return precautions.  I personally performed the services described in this documentation, which was  scribed in my presence. The recorded information has been reviewed and is accurate.  Final Clinical Impressions(s) / ED Diagnoses   Final diagnoses:  Acute midline low back pain without sciatica    New Prescriptions New Prescriptions   DIAZEPAM (VALIUM) 5 MG TABLET    Take 1 tablet (5 mg total) by mouth 2 (two) times daily.   OXYCODONE-ACETAMINOPHEN (PERCOCET/ROXICET) 5-325 MG TABLET    Take 1 tablet by mouth every 4 (four) hours as needed for severe pain.     Chesley Noon [place], [place] 01/23/16 [place] Yao, MD 01/23/16 [phone]

## 2016-01-23 NOTE — ED Triage Notes (Signed)
Pt has hx of back pain x 3 years.  Pt was coughing last night, felt tug.  Unable to move well today.  Took home meds including oxycodone x 1 hour ago with no relief.

## 2016-01-29 DIAGNOSIS — I2510 Atherosclerotic heart disease of native coronary artery without angina pectoris: Secondary | ICD-10-CM | POA: Insufficient documentation

## 2016-06-06 ENCOUNTER — Other Ambulatory Visit: Payer: Self-pay | Admitting: Neurological Surgery

## 2016-06-06 DIAGNOSIS — M5416 Radiculopathy, lumbar region: Secondary | ICD-10-CM | POA: Insufficient documentation

## 2016-06-09 ENCOUNTER — Other Ambulatory Visit (HOSPITAL_COMMUNITY): Payer: Self-pay | Admitting: Neurological Surgery

## 2016-06-09 DIAGNOSIS — M4727 Other spondylosis with radiculopathy, lumbosacral region: Secondary | ICD-10-CM

## 2016-06-10 NOTE — Pre-Procedure Instructions (Signed)
Billy Blevins  06/10/2016      [id number] [place], [place] [address] [place] [address] [place] [address] Phone: [phone] Fax: [phone]  [facility] [place], [place] [address] Phone: [phone] Fax: [phone]  [place], [place] [facility] [address] [place] [address] [place] [address] Phone: [phone] Fax: [phone]    Your procedure is scheduled on February 22  Report to [facility] at 1000 A.M.  Call this number if you have problems the morning of surgery:  [phone]   Remember:  Do not eat food or drink liquids after midnight.   Take these medicines the morning of surgery with A SIP OF WATER fluticasone (FLONASE), HYDROcodone-acetaminophen (NORCO/VICODIN), metoprolol succinate (TOPROL-XL), omeprazole (PRILOSEC)  7 days prior to surgery STOP taking any meloxicam (MOBIC)  Aspirin, Aleve, Naproxen, Ibuprofen, Motrin, Advil, Goody's, BC's, all herbal medications, fish oil, and all vitamins    Do not wear jewelry.  Do not wear lotions, powders, or cologne, or deoderant.  Men may shave face and neck.  Do not bring valuables to the hospital.  [facility] is not responsible for any belongings or valuables.  Contacts, dentures or bridgework may not be worn into surgery.  Leave your suitcase in the car.  After surgery it may be brought to your room.  For patients admitted to the hospital, discharge time will be determined by your treatment team.  Patients discharged the day of surgery will not be allowed to drive home.    Special instructions:   [facility]- Preparing For Surgery  Before surgery, you can play an important role. Because skin is not sterile, your skin needs to be as free of germs as possible. You can reduce the number of germs on your skin  by washing with CHG (chlorahexidine gluconate) Soap before surgery.  CHG is an antiseptic cleaner which kills germs and bonds with the skin to continue killing germs even after washing.  Please do not use if you have an allergy to CHG or antibacterial soaps. If your skin becomes reddened/irritated stop using the CHG.  Do not shave (including legs and underarms) for at least 48 hours prior to first CHG shower. It is OK to shave your face.  Please follow these instructions carefully.   1. Shower the NIGHT BEFORE SURGERY and the MORNING OF SURGERY with CHG.   2. If you chose to wash your hair, wash your hair first as usual with your normal shampoo.  3. After you shampoo, rinse your hair and body thoroughly to remove the shampoo.  4. Use CHG as you would any other liquid soap. You can apply CHG directly to the skin and wash gently with a scrungie or a clean washcloth.   5. Apply the CHG Soap to your body ONLY FROM THE NECK DOWN.  Do not use on open wounds or open sores. Avoid contact with your eyes, ears, mouth and genitals (private parts). Wash genitals (private parts) with your normal soap.  6. Wash thoroughly, paying special attention to the area where your surgery will be performed.  7. Thoroughly rinse your body with warm water from the neck down.  8. DO NOT shower/wash with your normal soap after using and rinsing off the CHG Soap.  9. Pat yourself dry with a CLEAN TOWEL.  10. Wear CLEAN PAJAMAS   11. Place CLEAN SHEETS on your bed the night of your first shower and DO NOT SLEEP WITH PETS.    Day of Surgery: Do not apply any deodorants/lotions. Please wear clean clothes to the hospital/surgery center.      Please read over the following fact sheets that you were given.

## 2016-06-11 ENCOUNTER — Encounter (HOSPITAL_COMMUNITY)
Admission: RE | Admit: 2016-06-11 | Discharge: 2016-06-11 | Disposition: A | Discharge status: Home / Self Care | Payer: 59 | Source: Ambulatory Visit | Attending: Neurological Surgery | Admitting: Neurological Surgery

## 2016-06-11 ENCOUNTER — Encounter (HOSPITAL_COMMUNITY): Payer: Self-pay

## 2016-06-11 DIAGNOSIS — M4727 Other spondylosis with radiculopathy, lumbosacral region: Secondary | ICD-10-CM | POA: Insufficient documentation

## 2016-06-11 DIAGNOSIS — Z01818 Encounter for other preprocedural examination: Secondary | ICD-10-CM | POA: Insufficient documentation

## 2016-06-11 HISTORY — DX: Essential (primary) hypertension: I10

## 2016-06-11 HISTORY — DX: Cardiac arrhythmia, unspecified: I49.9

## 2016-06-11 LAB — CBC
HEMATOCRIT: 40.8 % (ref 39.0–52.0)
Hemoglobin: 14.5 g/dL (ref 13.0–17.0)
MCH: 29.1 pg (ref 26.0–34.0)
MCHC: 35.5 g/dL (ref 30.0–36.0)
MCV: 81.8 fL (ref 78.0–100.0)
PLATELETS: 160 10*3/uL (ref 150–400)
RBC: 4.99 MIL/uL (ref 4.22–5.81)
RDW: 12.7 % (ref 11.5–15.5)
WBC: 7.4 10*3/uL (ref 4.0–10.5)

## 2016-06-11 LAB — PROTIME-INR
INR: 0.97
PROTHROMBIN TIME: 12.9 s (ref 11.4–15.2)

## 2016-06-11 LAB — BASIC METABOLIC PANEL
ANION GAP: 8 (ref 5–15)
BUN: 11 mg/dL (ref 6–20)
CALCIUM: 9.2 mg/dL (ref 8.9–10.3)
CO2: 27 mmol/L (ref 22–32)
CREATININE: 0.99 mg/dL (ref 0.61–1.24)
Chloride: 105 mmol/L (ref 101–111)
GFR calc Af Amer: >60 mL/min (ref >60)
GLUCOSE: 106 mg/dL — AB (ref 65–99)
Potassium: 3.9 mmol/L (ref 3.5–5.1)
Sodium: 140 mmol/L (ref 135–145)

## 2016-06-11 LAB — TYPE AND SCREEN
ABO/RH(D): O POS
ANTIBODY SCREEN: NEGATIVE

## 2016-06-11 LAB — APTT: APTT: 30 s (ref 24–36)

## 2016-06-11 LAB — ABO/RH: ABO/RH(D): O POS

## 2016-06-11 NOTE — Progress Notes (Signed)
Anesthesia Chart Review: Patient is a 41 year old male scheduled for L5-S1 laminectomy left facetectomy, L5-S1 posterior lumbar interbody fusion on 06/12/2016 by Dr. Cyndy Freeze. PAT was at 3 PM on 06/11/2016.  History includes former smoker (quit '08), post-operative N/V, HTN, hyperactive gag reflex, HLD, chronic back pain, GERD, nephrolithiasis, head injury (tree fell on head), irregular heart rhythm (PVC's), strabismus surgery, adenoidectomy, lumbar laminectomy '13, vasectomy '17.   PCP is Dr. Helane Rima, [facility]. Cardiologist is Dr. Lamar Blinks with [facility]. He underwent recent work-up for chest pain and was noted to have PVCs on ETT. (See testing results below.)  Meds include aspirin 81 mg, Zyrtec, Flonase, Norco, ibuprofen, Toprol-XL, Prilosec, Zocor.  BP 127/90   Pulse 64   Temp 36.4 C   Resp 20   Ht 5\' 10"  (1.778 m)   Wt 218 lb (98.9 kg)   SpO2 97%   BMI 31.28 kg/m   EKG 10/29/15 [facility]): SR occasional ectopic ventricular beat, RSR prime V1.   Exercise stress test 01/14/16 ([facility]; Care Everywhere): Result Impression  The patient exercised on a normal protocol for 7 minutes to a maximum heart rate of 166 which is 92 % of predicted maximum heart rate. Workload achieved was 10.8 Mets. The patient had no exercise induced chest pain or ischemic ECG changes. The patient did have frequent PVCs and some ventricular couplets and 2 episodes of 4 beat runs of ventricular tachycardia during exercise.  1. No chest pain or ischemic ECG changes with exercise, but the patient did have frequent PVCs and 4 beat runs of ventricular tachycardia with exercise 2. Very good exercise tolerance  3. Normal blood pressure response with exercise 4. Negative treadmill test for ischemia by ECG criteria, but abnormal treadmill test for exercise-induced PVCs and ventricular tachycardia. 5. Coronary CT angiogram was recommended and is being arranged   CT cardiac 3D  01/28/16 [facility]; Care Everywhere): FINDINGS: CALCIUM SCORE: There is a tiny speck of calcium in the LAD. The calcium score was 0 LV FUNCTION: The calculated left ventricular ejection fraction is 61%. Left ventricular size is normal. Wall motion is normal. Mitral and aortic valves appear grossly normal. CORONARY ARTERIES: - LEFT MAIN: The left main originates the left coronary cusp. It is a large vessel divides into the LAD and circumflex branches.. - LAD: LAD is a moderate caliber vessel that reaches the apex. It becomes diminutive distally. There is an area of stenosis of less than 50% within the midportion of the LAD. There is mild areas of noncalcified plaque. Otherwise vessels seems widely  patent. A single diagonal branch is noted which also has a proximal area of stenosis of less than 50%. - CIRCUMFLEX: Circumflex complex large vessel that originates from the left main. Divides into 2 marginal branches. There is mild areas of noncalcified plaque. Otherwise the vessel is widely patent without obstructive coronary disease - RCA: The right coronary artery originates from the right coronary cusp. It appears to be a small vessel and only the most proximal portion of the vessel is visualized.  IMPRESSION: Mild nonobstructive coronary artery disease as described. However the right coronary artery past its most proximal portion cannot be visualized. Normal left ventricular function and wall motion.  Preoperative labs noted.  If no acute changes then I would anticipate that he could proceed as planned.  George Hugh [facility] Short Stay Center/Anesthesiology Phone [phone] 06/11/2016 5:01 PM

## 2016-06-11 NOTE — Progress Notes (Addendum)
PCP - Ch[place]nn Court Cardiologist - [facility] in [place]  Chest x-ray - not needed EKG - requesting  Stress Test - 01/14/16 ECHO - denies Cardiac Cath - denies  Spoke with medical records at [facility] and they are sending EKG over Patient's cardiologist does not know that he is having surgery Sending to anesthesia for review of ekg when it arrives  Last does of aspirin was 06/06/16    Patient denies shortness of breath, fever, cough and chest pain at PAT appointment   Patient verbalized understanding of instructions that was given to them at the PAT appointment. Patient expressed that there were no further questions.  Patient was also instructed that they will need to review over the PAT instructions again at home before the surgery.

## 2016-06-12 ENCOUNTER — Inpatient Hospital Stay (HOSPITAL_COMMUNITY): Payer: 59 | Admitting: Certified Registered"

## 2016-06-12 ENCOUNTER — Inpatient Hospital Stay (HOSPITAL_COMMUNITY): Payer: 59 | Admitting: Vascular Surgery

## 2016-06-12 ENCOUNTER — Encounter (HOSPITAL_COMMUNITY): Payer: Self-pay | Admitting: *Deleted

## 2016-06-12 ENCOUNTER — Inpatient Hospital Stay (HOSPITAL_COMMUNITY): Payer: 59

## 2016-06-12 ENCOUNTER — Inpatient Hospital Stay (HOSPITAL_COMMUNITY)
Admission: RE | Admit: 2016-06-12 | Discharge: 2016-06-13 | DRG: 460 | Disposition: A | Discharge status: Home / Self Care | Payer: 59 | Source: Ambulatory Visit | Attending: Neurological Surgery | Admitting: Neurological Surgery

## 2016-06-12 ENCOUNTER — Encounter (HOSPITAL_COMMUNITY)
Admission: RE | Disposition: A | Discharge status: Home / Self Care | Payer: Self-pay | Source: Ambulatory Visit | Attending: Neurological Surgery

## 2016-06-12 DIAGNOSIS — M51369 Other intervertebral disc degeneration, lumbar region without mention of lumbar back pain or lower extremity pain: Secondary | ICD-10-CM

## 2016-06-12 DIAGNOSIS — Z7982 Long term (current) use of aspirin: Secondary | ICD-10-CM | POA: Diagnosis not present

## 2016-06-12 DIAGNOSIS — Z87828 Personal history of other (healed) physical injury and trauma: Secondary | ICD-10-CM | POA: Diagnosis not present

## 2016-06-12 DIAGNOSIS — J309 Allergic rhinitis, unspecified: Secondary | ICD-10-CM | POA: Diagnosis present

## 2016-06-12 DIAGNOSIS — Z791 Long term (current) use of non-steroidal anti-inflammatories (NSAID): Secondary | ICD-10-CM | POA: Diagnosis not present

## 2016-06-12 DIAGNOSIS — Z87891 Personal history of nicotine dependence: Secondary | ICD-10-CM | POA: Diagnosis not present

## 2016-06-12 DIAGNOSIS — Z79899 Other long term (current) drug therapy: Secondary | ICD-10-CM

## 2016-06-12 DIAGNOSIS — M4727 Other spondylosis with radiculopathy, lumbosacral region: Secondary | ICD-10-CM | POA: Diagnosis present

## 2016-06-12 DIAGNOSIS — M5136 Other intervertebral disc degeneration, lumbar region: Secondary | ICD-10-CM

## 2016-06-12 DIAGNOSIS — Z419 Encounter for procedure for purposes other than remedying health state, unspecified: Secondary | ICD-10-CM

## 2016-06-12 DIAGNOSIS — K219 Gastro-esophageal reflux disease without esophagitis: Secondary | ICD-10-CM | POA: Diagnosis present

## 2016-06-12 DIAGNOSIS — I10 Essential (primary) hypertension: Secondary | ICD-10-CM | POA: Diagnosis present

## 2016-06-12 DIAGNOSIS — M48061 Spinal stenosis, lumbar region without neurogenic claudication: Secondary | ICD-10-CM | POA: Diagnosis present

## 2016-06-12 DIAGNOSIS — Z87442 Personal history of urinary calculi: Secondary | ICD-10-CM

## 2016-06-12 DIAGNOSIS — Z79891 Long term (current) use of opiate analgesic: Secondary | ICD-10-CM | POA: Diagnosis not present

## 2016-06-12 DIAGNOSIS — E785 Hyperlipidemia, unspecified: Secondary | ICD-10-CM | POA: Diagnosis present

## 2016-06-12 HISTORY — PX: APPLICATION OF ROBOTIC ASSISTANCE FOR SPINAL PROCEDURE: SHX6753

## 2016-06-12 LAB — SURGICAL PCR SCREEN
MRSA, PCR: POSITIVE — AB
STAPHYLOCOCCUS AUREUS: POSITIVE — AB

## 2016-06-12 SURGERY — POSTERIOR LUMBAR FUSION 1 LEVEL
Anesthesia: General | Site: Back

## 2016-06-12 MED ORDER — LACTATED RINGERS IV SOLN
INTRAVENOUS | Status: DC
  Administered 2016-06-12 (×3): via INTRAVENOUS

## 2016-06-12 MED ORDER — BUPIVACAINE HCL (PF) 0.25 % IJ SOLN
INTRAMUSCULAR | Status: DC | PRN
  Administered 2016-06-12: 1 mL

## 2016-06-12 MED ORDER — CELECOXIB 200 MG PO CAPS
200.0000 mg | ORAL_CAPSULE | Freq: Two times a day (BID) | ORAL | Status: DC
  Administered 2016-06-12 – 2016-06-13 (×2): 200 mg via ORAL
  Filled 2016-06-12 (×2): qty 1

## 2016-06-12 MED ORDER — PANTOPRAZOLE SODIUM 40 MG PO TBEC: 40.0000 mg | DELAYED_RELEASE_TABLET | Freq: Every day | ORAL | Status: DC

## 2016-06-12 MED ORDER — FLEET ENEMA 7-19 GM/118ML RE ENEM: 1.0000 | ENEMA | Freq: Once | RECTAL | Status: DC | PRN

## 2016-06-12 MED ORDER — SODIUM CHLORIDE 0.9% FLUSH
3.0000 mL | Freq: Two times a day (BID) | INTRAVENOUS | Status: DC
  Administered 2016-06-12 – 2016-06-13 (×2): 3 mL via INTRAVENOUS

## 2016-06-12 MED ORDER — BACITRACIN 50000 UNITS IM SOLR
INTRAMUSCULAR | Status: DC | PRN
  Administered 2016-06-12: 500 mL

## 2016-06-12 MED ORDER — SENNA 8.6 MG PO TABS
1.0000 | ORAL_TABLET | Freq: Two times a day (BID) | ORAL | Status: DC
  Administered 2016-06-12 – 2016-06-13 (×2): 8.6 mg via ORAL
  Filled 2016-06-12 (×2): qty 1

## 2016-06-12 MED ORDER — CEFAZOLIN IN D5W 1 GM/50ML IV SOLN
1.0000 g | Freq: Three times a day (TID) | INTRAVENOUS | Status: DC
  Administered 2016-06-13 (×2): 1 g via INTRAVENOUS
  Filled 2016-06-12 (×2): qty 50

## 2016-06-12 MED ORDER — SURGIFOAM 100 EX MISC
CUTANEOUS | Status: DC | PRN
  Administered 2016-06-12: 20 mL via TOPICAL

## 2016-06-12 MED ORDER — ONDANSETRON HCL 4 MG/2ML IJ SOLN: 4.0000 mg | Freq: Four times a day (QID) | INTRAMUSCULAR | Status: DC | PRN

## 2016-06-12 MED ORDER — GABAPENTIN 300 MG PO CAPS
300.0000 mg | ORAL_CAPSULE | Freq: Three times a day (TID) | ORAL | Status: DC
  Administered 2016-06-12 – 2016-06-13 (×2): 300 mg via ORAL
  Filled 2016-06-12 (×2): qty 1

## 2016-06-12 MED ORDER — THROMBIN 5000 UNITS EX SOLR
CUTANEOUS | Status: AC
  Filled 2016-06-12: qty 5000

## 2016-06-12 MED ORDER — CEFAZOLIN SODIUM-DEXTROSE 2-4 GM/100ML-% IV SOLN
INTRAVENOUS | Status: AC
  Filled 2016-06-12: qty 100

## 2016-06-12 MED ORDER — LORATADINE 10 MG PO TABS
10.0000 mg | ORAL_TABLET | Freq: Every day | ORAL | Status: DC
  Administered 2016-06-13: 10 mg via ORAL
  Filled 2016-06-12: qty 1

## 2016-06-12 MED ORDER — FENTANYL CITRATE (PF) 100 MCG/2ML IJ SOLN
INTRAMUSCULAR | Status: DC | PRN
  Administered 2016-06-12 (×3): 50 ug via INTRAVENOUS
  Administered 2016-06-12: 150 ug via INTRAVENOUS

## 2016-06-12 MED ORDER — LIDOCAINE-EPINEPHRINE 2 %-1:100000 IJ SOLN
INTRAMUSCULAR | Status: AC
  Filled 2016-06-12: qty 1

## 2016-06-12 MED ORDER — KETOROLAC TROMETHAMINE 30 MG/ML IJ SOLN
INTRAMUSCULAR | Status: AC
  Filled 2016-06-12: qty 1

## 2016-06-12 MED ORDER — CHLORHEXIDINE GLUCONATE CLOTH 2 % EX PADS: 6.0000 | MEDICATED_PAD | Freq: Once | CUTANEOUS | Status: DC

## 2016-06-12 MED ORDER — FENTANYL CITRATE (PF) 100 MCG/2ML IJ SOLN
INTRAMUSCULAR | Status: AC
  Filled 2016-06-12: qty 4

## 2016-06-12 MED ORDER — ONE-DAILY MULTI VITAMINS PO TABS: 1.0000 | ORAL_TABLET | Freq: Every day | ORAL | Status: DC

## 2016-06-12 MED ORDER — LIDOCAINE HCL (CARDIAC) 20 MG/ML IV SOLN
INTRAVENOUS | Status: DC | PRN
  Administered 2016-06-12: 60 mg via INTRAVENOUS

## 2016-06-12 MED ORDER — ACETAMINOPHEN 10 MG/ML IV SOLN
INTRAVENOUS | Status: AC
  Filled 2016-06-12: qty 100

## 2016-06-12 MED ORDER — SUGAMMADEX SODIUM 200 MG/2ML IV SOLN
INTRAVENOUS | Status: AC
  Filled 2016-06-12: qty 2

## 2016-06-12 MED ORDER — METHYLPREDNISOLONE ACETATE 80 MG/ML IJ SUSP
INTRAMUSCULAR | Status: AC
  Filled 2016-06-12: qty 1

## 2016-06-12 MED ORDER — OXYCODONE HCL 5 MG/5ML PO SOLN: 5.0000 mg | Freq: Once | ORAL | Status: AC | PRN

## 2016-06-12 MED ORDER — ACETAMINOPHEN 500 MG PO TABS
ORAL_TABLET | ORAL | Status: AC
  Filled 2016-06-12: qty 2

## 2016-06-12 MED ORDER — ADULT MULTIVITAMIN W/MINERALS CH
1.0000 | ORAL_TABLET | Freq: Every day | ORAL | Status: DC
  Administered 2016-06-13: 1 via ORAL
  Filled 2016-06-12: qty 1

## 2016-06-12 MED ORDER — BUPIVACAINE LIPOSOME 1.3 % IJ SUSP
20.0000 mL | INTRAMUSCULAR | Status: AC
  Administered 2016-06-12: 20 mL
  Filled 2016-06-12: qty 20

## 2016-06-12 MED ORDER — PHENYLEPHRINE HCL 10 MG/ML IJ SOLN
INTRAMUSCULAR | Status: DC | PRN
  Administered 2016-06-12: 80 ug via INTRAVENOUS
  Administered 2016-06-12: 120 ug via INTRAVENOUS

## 2016-06-12 MED ORDER — FLUTICASONE PROPIONATE 50 MCG/ACT NA SUSP
2.0000 | Freq: Every day | NASAL | Status: DC
  Administered 2016-06-13: 2 via NASAL
  Filled 2016-06-12: qty 16

## 2016-06-12 MED ORDER — ACETAMINOPHEN 500 MG PO TABS
1000.0000 mg | ORAL_TABLET | Freq: Four times a day (QID) | ORAL | Status: DC
  Administered 2016-06-12 – 2016-06-13 (×3): 1000 mg via ORAL
  Filled 2016-06-12 (×3): qty 2

## 2016-06-12 MED ORDER — HYDROMORPHONE HCL 1 MG/ML IJ SOLN
INTRAMUSCULAR | Status: AC
  Filled 2016-06-12: qty 1

## 2016-06-12 MED ORDER — ZOLPIDEM TARTRATE 5 MG PO TABS: 5.0000 mg | ORAL_TABLET | Freq: Every evening | ORAL | Status: DC | PRN

## 2016-06-12 MED ORDER — SODIUM CHLORIDE 0.9% FLUSH
3.0000 mL | INTRAVENOUS | Status: DC | PRN
Start: 2016-06-12 — End: 2016-06-13

## 2016-06-12 MED ORDER — BUPIVACAINE HCL (PF) 0.25 % IJ SOLN
INTRAMUSCULAR | Status: AC
  Filled 2016-06-12: qty 30

## 2016-06-12 MED ORDER — OXYCODONE HCL 5 MG PO TABS
5.0000 mg | ORAL_TABLET | Freq: Once | ORAL | Status: AC | PRN
  Administered 2016-06-12: 5 mg via ORAL

## 2016-06-12 MED ORDER — ACETAMINOPHEN 10 MG/ML IV SOLN
1000.0000 mg | Freq: Once | INTRAVENOUS | Status: AC
  Administered 2016-06-12: 1000 mg via INTRAVENOUS

## 2016-06-12 MED ORDER — VANCOMYCIN HCL 1000 MG IV SOLR
INTRAVENOUS | Status: AC
  Filled 2016-06-12: qty 1000

## 2016-06-12 MED ORDER — SIMVASTATIN 20 MG PO TABS: 40.0000 mg | ORAL_TABLET | Freq: Every evening | ORAL | Status: DC

## 2016-06-12 MED ORDER — EPHEDRINE SULFATE 50 MG/ML IJ SOLN
INTRAMUSCULAR | Status: DC | PRN
  Administered 2016-06-12: 15 mg via INTRAVENOUS
  Administered 2016-06-12 (×2): 10 mg via INTRAVENOUS

## 2016-06-12 MED ORDER — ONDANSETRON HCL 4 MG/2ML IJ SOLN
INTRAMUSCULAR | Status: AC
  Filled 2016-06-12: qty 2

## 2016-06-12 MED ORDER — CEFAZOLIN SODIUM-DEXTROSE 2-4 GM/100ML-% IV SOLN
2.0000 g | INTRAVENOUS | Status: AC
  Administered 2016-06-12: 2 g via INTRAVENOUS

## 2016-06-12 MED ORDER — OXYCODONE HCL 5 MG PO TABS
ORAL_TABLET | ORAL | Status: AC
  Filled 2016-06-12: qty 1

## 2016-06-12 MED ORDER — PHENOL 1.4 % MT LIQD: 1.0000 | OROMUCOSAL | Status: DC | PRN

## 2016-06-12 MED ORDER — VANCOMYCIN HCL 1000 MG IV SOLR
INTRAVENOUS | Status: DC | PRN
  Administered 2016-06-12: 1000 mg

## 2016-06-12 MED ORDER — MUPIROCIN 2 % EX OINT
1.0000 "application " | TOPICAL_OINTMENT | Freq: Two times a day (BID) | CUTANEOUS | Status: DC
  Administered 2016-06-12 – 2016-06-13 (×2): 1 via NASAL
  Filled 2016-06-12: qty 22

## 2016-06-12 MED ORDER — MIDAZOLAM HCL 2 MG/2ML IJ SOLN
INTRAMUSCULAR | Status: AC
  Filled 2016-06-12: qty 2

## 2016-06-12 MED ORDER — OXYCODONE HCL ER 20 MG PO T12A
20.0000 mg | EXTENDED_RELEASE_TABLET | Freq: Two times a day (BID) | ORAL | Status: DC
  Administered 2016-06-12 – 2016-06-13 (×2): 20 mg via ORAL
  Filled 2016-06-12 (×2): qty 1

## 2016-06-12 MED ORDER — METHYLPREDNISOLONE ACETATE 80 MG/ML IJ SUSP
INTRAMUSCULAR | Status: DC | PRN
  Administered 2016-06-12: 80 mg

## 2016-06-12 MED ORDER — METOPROLOL SUCCINATE ER 25 MG PO TB24
50.0000 mg | ORAL_TABLET | Freq: Every day | ORAL | Status: DC
  Administered 2016-06-13: 50 mg via ORAL
  Filled 2016-06-12: qty 2

## 2016-06-12 MED ORDER — ROCURONIUM BROMIDE 50 MG/5ML IV SOSY
PREFILLED_SYRINGE | INTRAVENOUS | Status: AC
  Filled 2016-06-12: qty 5

## 2016-06-12 MED ORDER — PROPOFOL 10 MG/ML IV BOLUS
INTRAVENOUS | Status: AC
  Filled 2016-06-12: qty 20

## 2016-06-12 MED ORDER — THROMBIN 5000 UNITS EX SOLR
OROMUCOSAL | Status: DC | PRN
  Administered 2016-06-12 (×2): 5 mL via TOPICAL

## 2016-06-12 MED ORDER — DIAZEPAM 5 MG PO TABS
ORAL_TABLET | ORAL | Status: AC
  Filled 2016-06-12: qty 1

## 2016-06-12 MED ORDER — ALBUMIN HUMAN 5 % IV SOLN
INTRAVENOUS | Status: DC | PRN
  Administered 2016-06-12 (×2): via INTRAVENOUS

## 2016-06-12 MED ORDER — 0.9 % SODIUM CHLORIDE (POUR BTL) OPTIME
TOPICAL | Status: DC | PRN
  Administered 2016-06-12: 1000 mL

## 2016-06-12 MED ORDER — SUGAMMADEX SODIUM 200 MG/2ML IV SOLN
INTRAVENOUS | Status: DC | PRN
  Administered 2016-06-12: 200 mg via INTRAVENOUS

## 2016-06-12 MED ORDER — SODIUM CHLORIDE 0.9 % IV SOLN: 250.0000 mL | INTRAVENOUS | Status: DC

## 2016-06-12 MED ORDER — SODIUM CHLORIDE 0.9 % IJ SOLN
INTRAMUSCULAR | Status: DC | PRN
  Administered 2016-06-12: 10 mL via INTRAVENOUS

## 2016-06-12 MED ORDER — ROCURONIUM BROMIDE 100 MG/10ML IV SOLN
INTRAVENOUS | Status: DC | PRN
  Administered 2016-06-12: 50 mg via INTRAVENOUS

## 2016-06-12 MED ORDER — BUPIVACAINE-EPINEPHRINE (PF) 0.5% -1:200000 IJ SOLN
INTRAMUSCULAR | Status: AC
  Filled 2016-06-12: qty 30

## 2016-06-12 MED ORDER — OXYCODONE HCL 5 MG PO TABS
5.0000 mg | ORAL_TABLET | ORAL | Status: DC | PRN
  Administered 2016-06-13 (×2): 10 mg via ORAL
  Filled 2016-06-12 (×2): qty 2

## 2016-06-12 MED ORDER — PROPOFOL 10 MG/ML IV BOLUS
INTRAVENOUS | Status: DC | PRN
  Administered 2016-06-12: 200 mg via INTRAVENOUS

## 2016-06-12 MED ORDER — ONDANSETRON HCL 4 MG/2ML IJ SOLN: 4.0000 mg | INTRAMUSCULAR | Status: DC | PRN

## 2016-06-12 MED ORDER — BUPIVACAINE-EPINEPHRINE (PF) 0.5% -1:200000 IJ SOLN
INTRAMUSCULAR | Status: DC | PRN
  Administered 2016-06-12: 10 mL

## 2016-06-12 MED ORDER — ONDANSETRON HCL 4 MG/2ML IJ SOLN
INTRAMUSCULAR | Status: DC | PRN
  Administered 2016-06-12: 4 mg via INTRAVENOUS

## 2016-06-12 MED ORDER — FENTANYL CITRATE (PF) 100 MCG/2ML IJ SOLN
INTRAMUSCULAR | Status: AC
  Filled 2016-06-12: qty 2

## 2016-06-12 MED ORDER — LIDOCAINE 2% (20 MG/ML) 5 ML SYRINGE
INTRAMUSCULAR | Status: AC
  Filled 2016-06-12: qty 5

## 2016-06-12 MED ORDER — MENTHOL 3 MG MT LOZG
1.0000 | LOZENGE | OROMUCOSAL | Status: DC | PRN
Start: 2016-06-12 — End: 2016-06-13

## 2016-06-12 MED ORDER — ASPIRIN EC 81 MG PO TBEC
81.0000 mg | DELAYED_RELEASE_TABLET | Freq: Every day | ORAL | Status: DC
  Administered 2016-06-13: 81 mg via ORAL
  Filled 2016-06-12: qty 1

## 2016-06-12 MED ORDER — MIDAZOLAM HCL 5 MG/5ML IJ SOLN
INTRAMUSCULAR | Status: DC | PRN
  Administered 2016-06-12: 2 mg via INTRAVENOUS

## 2016-06-12 MED ORDER — BISACODYL 5 MG PO TBEC: 5.0000 mg | DELAYED_RELEASE_TABLET | Freq: Every day | ORAL | Status: DC | PRN

## 2016-06-12 MED ORDER — SODIUM CHLORIDE 0.9 % IV SOLN: INTRAVENOUS | Status: DC

## 2016-06-12 MED ORDER — PANTOPRAZOLE SODIUM 40 MG IV SOLR
40.0000 mg | Freq: Every day | INTRAVENOUS | Status: DC
  Administered 2016-06-12: 40 mg via INTRAVENOUS
  Filled 2016-06-12: qty 40

## 2016-06-12 MED ORDER — DOCUSATE SODIUM 100 MG PO CAPS
100.0000 mg | ORAL_CAPSULE | Freq: Two times a day (BID) | ORAL | Status: DC
  Administered 2016-06-12 – 2016-06-13 (×2): 100 mg via ORAL
  Filled 2016-06-12 (×2): qty 1

## 2016-06-12 MED ORDER — KETOROLAC TROMETHAMINE 30 MG/ML IJ SOLN
INTRAMUSCULAR | Status: DC | PRN
  Administered 2016-06-12: 30 mg via INTRA_ARTICULAR

## 2016-06-12 MED ORDER — SODIUM CHLORIDE 0.9 % IJ SOLN
INTRAMUSCULAR | Status: AC
  Filled 2016-06-12: qty 10

## 2016-06-12 MED ORDER — PHENYLEPHRINE HCL 10 MG/ML IJ SOLN
INTRAVENOUS | Status: DC | PRN
  Administered 2016-06-12: 30 ug/min via INTRAVENOUS

## 2016-06-12 MED ORDER — GABAPENTIN 300 MG PO CAPS
300.0000 mg | ORAL_CAPSULE | ORAL | Status: AC
  Administered 2016-06-12: 300 mg via ORAL

## 2016-06-12 MED ORDER — THROMBIN 20000 UNITS EX SOLR
CUTANEOUS | Status: AC
  Filled 2016-06-12: qty 20000

## 2016-06-12 MED ORDER — LIDOCAINE-EPINEPHRINE 2 %-1:100000 IJ SOLN
INTRAMUSCULAR | Status: DC | PRN
  Administered 2016-06-12: 10 mL

## 2016-06-12 MED ORDER — GABAPENTIN 300 MG PO CAPS
ORAL_CAPSULE | ORAL | Status: AC
  Administered 2016-06-12: 300 mg via ORAL
  Filled 2016-06-12: qty 1

## 2016-06-12 MED ORDER — HYDROMORPHONE HCL 1 MG/ML IJ SOLN
0.2500 mg | INTRAMUSCULAR | Status: DC | PRN
  Administered 2016-06-12: 0.5 mg via INTRAVENOUS

## 2016-06-12 MED ORDER — CHLORHEXIDINE GLUCONATE CLOTH 2 % EX PADS
6.0000 | MEDICATED_PAD | Freq: Every day | CUTANEOUS | Status: DC
  Administered 2016-06-13: 6 via TOPICAL

## 2016-06-12 MED ORDER — DIAZEPAM 5 MG PO TABS
5.0000 mg | ORAL_TABLET | Freq: Four times a day (QID) | ORAL | Status: DC | PRN
  Administered 2016-06-12: 5 mg via ORAL

## 2016-06-12 MED ORDER — METHOCARBAMOL 1000 MG/10ML IJ SOLN
750.0000 mg | Freq: Once | INTRAVENOUS | Status: AC
  Administered 2016-06-12: .75 g via INTRAVENOUS
  Filled 2016-06-12: qty 7.5

## 2016-06-12 SURGICAL SUPPLY — 98 items
ADH SKN CLS APL DERMABOND .7 (GAUZE/BANDAGES/DRESSINGS) ×2
APL SKNCLS STERI-STRIP NONHPOA (GAUZE/BANDAGES/DRESSINGS) ×2
BAG DECANTER FOR FLEXI CONT (MISCELLANEOUS) ×3 IMPLANT
BENZOIN TINCTURE PRP APPL 2/3 (GAUZE/BANDAGES/DRESSINGS) ×3 IMPLANT
BIT DRILL LONG 3.0X30 (BIT) IMPLANT
BIT DRILL LONG 3X80 (BIT) IMPLANT
BIT DRILL LONG 4X80 (BIT) IMPLANT
BIT DRILL SHORT 3.0X30 (BIT) IMPLANT
BIT DRILL SHORT 3X80 (BIT) IMPLANT
BLADE CLIPPER SURG (BLADE) ×1 IMPLANT
BLADE SURG 11 STRL SS (BLADE) ×3 IMPLANT
BUR MATCHSTICK NEURO 3.0 LAGG (BURR) ×3 IMPLANT
BUR ROUND FLUTED 5 RND (BURR) ×3 IMPLANT
CANISTER SUCT 3000ML PPV (MISCELLANEOUS) ×7 IMPLANT
CAP RELINE MOD TULIP RMM (Cap) ×12 IMPLANT
CARTRIDGE OIL MAESTRO DRILL (MISCELLANEOUS) ×2 IMPLANT
CHLORAPREP W/TINT 26ML (MISCELLANEOUS) ×3 IMPLANT
CONT SPEC 4OZ CLIKSEAL STRL BL (MISCELLANEOUS) ×6 IMPLANT
CORDS BIPOLAR (ELECTRODE) ×2 IMPLANT
DECANTER SPIKE VIAL GLASS SM (MISCELLANEOUS) ×3 IMPLANT
DERMABOND ADVANCED (GAUZE/BANDAGES/DRESSINGS) ×1
DERMABOND ADVANCED .7 DNX12 (GAUZE/BANDAGES/DRESSINGS) ×2 IMPLANT
DIFFUSER DRILL AIR PNEUMATIC (MISCELLANEOUS) ×3 IMPLANT
DRAPE C-ARM 42X72 X-RAY (DRAPES) ×6 IMPLANT
DRAPE C-ARMOR (DRAPES) ×3 IMPLANT
DRAPE MICROSCOPE LEICA (MISCELLANEOUS) IMPLANT
DRAPE POUCH INSTRU U-SHP 10X18 (DRAPES) ×3 IMPLANT
DRAPE SHEET LG 3/4 BI-LAMINATE (DRAPES) ×3 IMPLANT
DRAPE SURG 17X23 STRL (DRAPES) ×3 IMPLANT
DRESSING OPSITE X SMALL 2X3 (GAUZE/BANDAGES/DRESSINGS) ×1 IMPLANT
DRSG OPSITE POSTOP 4X6 (GAUZE/BANDAGES/DRESSINGS) ×1 IMPLANT
ELECT REM PT RETURN 9FT ADLT (ELECTROSURGICAL) ×3
ELECTRODE REM PT RTRN 9FT ADLT (ELECTROSURGICAL) ×2 IMPLANT
EVACUATOR 1/8 PVC DRAIN (DRAIN) ×1 IMPLANT
GAUZE SPONGE 4X4 12PLY STRL (GAUZE/BANDAGES/DRESSINGS) IMPLANT
GAUZE SPONGE 4X4 16PLY XRAY LF (GAUZE/BANDAGES/DRESSINGS) IMPLANT
GLOVE BIO SURGEON STRL SZ7 (GLOVE) ×1 IMPLANT
GLOVE BIO SURGEON STRL SZ8 (GLOVE) ×1 IMPLANT
GLOVE BIO SURGEON STRL SZ8.5 (GLOVE) ×1 IMPLANT
GLOVE BIOGEL PI IND STRL 7.0 (GLOVE) IMPLANT
GLOVE BIOGEL PI IND STRL 7.5 (GLOVE) ×6 IMPLANT
GLOVE BIOGEL PI INDICATOR 7.0 (GLOVE) ×1
GLOVE BIOGEL PI INDICATOR 7.5 (GLOVE) ×4
GLOVE SS BIOGEL STRL SZ 7.5 (GLOVE) ×6 IMPLANT
GLOVE SUPERSENSE BIOGEL SZ 7.5 (GLOVE) ×3
GOWN STRL REUS W/ TWL LRG LVL3 (GOWN DISPOSABLE) ×6 IMPLANT
GOWN STRL REUS W/ TWL XL LVL3 (GOWN DISPOSABLE) IMPLANT
GOWN STRL REUS W/TWL LRG LVL3 (GOWN DISPOSABLE) ×12
GOWN STRL REUS W/TWL XL LVL3 (GOWN DISPOSABLE) ×3
GUIDEWIRE NITINOL BEVEL TIP (WIRE) ×4 IMPLANT
HEMOSTAT POWDER KIT SURGIFOAM (HEMOSTASIS) ×3 IMPLANT
IMPL TLX 7X11X26MM 20DEG (Cage) ×2 IMPLANT
KIT BASIN OR (CUSTOM PROCEDURE TRAY) ×3 IMPLANT
KIT INFUSE XX SMALL 0.7CC (Orthopedic Implant) ×2 IMPLANT
KIT ROOM TURNOVER OR (KITS) ×3 IMPLANT
KIT SPINE MAZOR X ROBO DISP (MISCELLANEOUS) ×3 IMPLANT
MODULE POWER NUVASIVE (MISCELLANEOUS) ×1 IMPLANT
NDL HYPO 18GX1.5 BLUNT FILL (NEEDLE) ×2 IMPLANT
NDL HYPO 21X1.5 SAFETY (NEEDLE) ×2 IMPLANT
NDL HYPO 25X1 1.5 SAFETY (NEEDLE) ×2 IMPLANT
NEEDLE HYPO 18GX1.5 BLUNT FILL (NEEDLE) ×3 IMPLANT
NEEDLE HYPO 21X1.5 SAFETY (NEEDLE) ×6 IMPLANT
NEEDLE HYPO 25X1 1.5 SAFETY (NEEDLE) ×3 IMPLANT
NS IRRIG 1000ML POUR BTL (IV SOLUTION) ×3 IMPLANT
OIL CARTRIDGE MAESTRO DRILL (MISCELLANEOUS) ×3
PACK LAMINECTOMY NEURO (CUSTOM PROCEDURE TRAY) ×3 IMPLANT
PACK UNIVERSAL I (CUSTOM PROCEDURE TRAY) ×3 IMPLANT
PAD ARMBOARD 7.5X6 YLW CONV (MISCELLANEOUS) ×9 IMPLANT
PATTIES SURGICAL .5X1.5 (GAUZE/BANDAGES/DRESSINGS) ×3 IMPLANT
PIN HEAD 2.5X60MM (PIN) IMPLANT
POWER MODULE NUVASIVE (MISCELLANEOUS) ×3
PUTTY DBM ALLOFUSE 5CC (Bone Implant) ×2 IMPLANT
ROD RELINE COCR LORD 5X40MM (Rod) ×4 IMPLANT
RUBBERBAND STERILE (MISCELLANEOUS) IMPLANT
SCREW LOCK RSS 4.5/5.0MM (Screw) ×4 IMPLANT
SCREW SCHANZ SA 4.0MM (MISCELLANEOUS) IMPLANT
SCREW SHANK RELINE 6.5X40MM (Screw) ×2 IMPLANT
SHANK RELINE MOD 5.5X40 (Screw) ×4 IMPLANT
SPONGE NEURO XRAY DETECT 1X3 (DISPOSABLE) ×3 IMPLANT
SPONGE SURGIFOAM ABS GEL 100 (HEMOSTASIS) ×3 IMPLANT
STRIP SURGICAL 1 X 6 IN (GAUZE/BANDAGES/DRESSINGS) IMPLANT
STRIP SURGICAL 1/2 X 6 IN (GAUZE/BANDAGES/DRESSINGS) IMPLANT
STRIP SURGICAL 1/4 X 6 IN (GAUZE/BANDAGES/DRESSINGS) IMPLANT
STRIP SURGICAL 3/4 X 6 IN (GAUZE/BANDAGES/DRESSINGS) IMPLANT
SUT STRATAFIX MNCRL+ 3-0 PS-2 (SUTURE) ×2
SUT STRATAFIX MONOCRYL 3-0 (SUTURE) ×1
SUT VIC AB 0 CT1 18XCR BRD8 (SUTURE) ×4 IMPLANT
SUT VIC AB 0 CT1 8-18 (SUTURE) ×6
SUT VIC AB 2-0 CT1 18 (SUTURE) ×6 IMPLANT
SUT VIC AB 3-0 SH 8-18 (SUTURE) ×8 IMPLANT
SUT VIC AB 4-0 PS2 27 (SUTURE) ×3 IMPLANT
SUTURE STRATFX MNCRL+ 3-0 PS-2 (SUTURE) ×2 IMPLANT
SYR 30ML LL (SYRINGE) ×6 IMPLANT
SYR 5ML LL (SYRINGE) ×3 IMPLANT
TLX IMPLANT 7X11X26MM 20DEG (Cage) ×6 IMPLANT
TOWEL OR 17X24 6PK STRL BLUE (TOWEL DISPOSABLE) ×5 IMPLANT
TOWEL OR 17X26 10 PK STRL BLUE (TOWEL DISPOSABLE) ×3 IMPLANT
WATER STERILE IRR 1000ML POUR (IV SOLUTION) ×3 IMPLANT

## 2016-06-12 NOTE — Progress Notes (Signed)
Orthopedic Tech Progress Note Patient Details:  Billy Blevins 09-16-1975 [id number] Called [employer] for brace Patient ID: Billy Blevins, male   DOB: Feb 18, 1976, 41 y.o.   MRN: [id number]   Braulio Bosch 06/12/2016, 6:00 PM

## 2016-06-12 NOTE — Anesthesia Postprocedure Evaluation (Signed)
Anesthesia Post Note  Patient: Billy Blevins  Procedure(s) Performed: Procedure(s) (LRB): Lumbar five-Sacrum one  Laminectomy with left facetectomy, Lumbar five-Sacrum one  Posterior lumbar interbody fusion, Lumbar five-Sacrum one  fixation/fusion with Mazor robot (N/A) APPLICATION OF ROBOTIC ASSISTANCE FOR SPINAL PROCEDURE (N/A)  Patient location during evaluation: PACU Anesthesia Type: General Level of consciousness: awake Pain management: pain level controlled Respiratory status: spontaneous breathing Cardiovascular status: stable Anesthetic complications: no       Last Vitals:  Vitals:   06/12/16 1608 06/12/16 1627  BP: 135/86   Pulse:  94  Resp:  12  Temp: 36.3 C     Last Pain:  Vitals:   06/12/16 1627  PainSc: 8     LLE Motor Response: Purposeful movement;Responds to commands (06/12/16 1627) LLE Sensation: Full sensation (06/12/16 1627) RLE Motor Response: Purposeful movement;Responds to commands (06/12/16 1627) RLE Sensation: Full sensation (06/12/16 1627)      Mikah Rottinghaus

## 2016-06-12 NOTE — Op Note (Signed)
06/12/2016  3:55 PM  PATIENT:  Billy Blevins  41 y.o. male  PRE-OPERATIVE DIAGNOSIS:  Lumbosacral spondylosis with radiculopathy, prior laminotomy left L5-S1  POST-OPERATIVE DIAGNOSIS:  Same  PROCEDURE:  Posterior lumbar interbody fusion L5-S1; Re-operative laminectomy left L5-S1, laminectomy right L5-S1; facetectomy left L5-S1 for decompression; use of BMP  SURGEON:  Aldean Ast, MD  ASSISTANTS: Newman Pies, MD  ANESTHESIA:   General  DRAINS: Medium Hemovac   SPECIMEN:  None  INDICATION FOR PROCEDURE: 41 year old male with medically intractable lumbar radiculopathy and severe left foraminal stenosis at L5-S1 on the left.  I recommended the above operation. Patient understood the risks, benefits, and alternatives and potential outcomes and wished to proceed.  PROCEDURE DETAILS: After smooth induction of general endotracheal anesthesia the patient was turned prone on the [place] table. The skin of the lumbar area was clipped of hair and wiped out with alcohol. It was prepped and draped in the usual sterile fashion. The planned incision was injected with a mixture of lidocaine and Marcaine with epinephrine.  The skin was opened sharply and a subperiosteal dissection was performed to expose the lateral edges of the lamina of L5-S1. Subperiosteal dissection was performed over the L5-S1 facet joints bilaterally.  Using the Mazor robot cortical screw trajectory holes were made at L5 and S1 bilaterally and K-wires were placed.  I then tapped over the K-wires, palpated with a ball tip pedicle probe, and placed screws at each level.  I separated scar from the cut bone edge of L5 on the left.  Using [place] and [place] rongeurs a wide decompression including superior facetectomy and partial inferior facetectomy was performed at L5-S1 to decompress the thecal sac and nerve roots. I completed the inferior facetectomy on the left to decompress the nerve on its entire course through the  foramen.  At L5-S1 an annulotomy was made. Using sequentially larger disc shavers this space was expanded. Disc material was removed with shavers, curettes, and the bone was decorticated with a rasp. The interbody space was packed with a combination BMP and locally harvested bone and two expandable lordotic spacers were inserted into the interbody space.  A lordotic rod was inserted and secured in the screw caps. Final tightening with a torque wrench was performed at all levels. Meticulous hemostasis was obtained. The wound was irrigated with bacitracin saline. Exparel was injected into the paraspinous muscles.  A medium Hemovac drain was placed below the fascia. About 1 g of vancomycin powder was inserted into the wound. The wound was closed in routine anatomic layers. The skin was closed with a running subcuticular monocryl suture and then sealed with dermabond.   The patient was then returned to the supine position on the bed.  PATIENT DISPOSITION:  PACU - hemodynamically stable.   Delay start of Pharmacological VTE agent (>24hrs) due to surgical blood loss or risk of bleeding:  yes

## 2016-06-12 NOTE — Progress Notes (Signed)
Orthopedic Tech Progress Note Patient Details:  Billy Blevins 26-Jun-1975 [id number] Brace order completed by [employer] Patient ID: [name], male   DOB: 05-16-1975, 41 y.o.   MRN: [id number]   Braulio Bosch 06/12/2016, 6:51 PM

## 2016-06-12 NOTE — Anesthesia Preprocedure Evaluation (Signed)
Anesthesia Evaluation  Patient identified by MRN, date of birth, ID band Patient awake    Reviewed: Allergy & Precautions, H&P , NPO status , Patient's Chart, lab work & pertinent test results  History of Anesthesia Complications (+) PONV and history of anesthetic complications  Airway Mallampati: II   Neck ROM: full    Dental   Pulmonary former smoker,    breath sounds clear to auscultation       Cardiovascular hypertension,  Rhythm:regular Rate:Normal     Neuro/Psych    GI/Hepatic GERD  ,  Endo/Other    Renal/GU      Musculoskeletal  (+) Arthritis ,   Abdominal   Peds  Hematology   Anesthesia Other Findings   Reproductive/Obstetrics                             Anesthesia Physical Anesthesia Plan  ASA: II  Anesthesia Plan: General   Post-op Pain Management:    Induction: Intravenous  Airway Management Planned: Oral ETT  Additional Equipment:   Intra-op Plan:   Post-operative Plan: Extubation in OR  Informed Consent: I have reviewed the patients History and Physical, chart, labs and discussed the procedure including the risks, benefits and alternatives for the proposed anesthesia with the patient or authorized representative who has indicated his/her understanding and acceptance.     Plan Discussed with: CRNA, Anesthesiologist and Surgeon  Anesthesia Plan Comments:         Anesthesia Quick Evaluation

## 2016-06-12 NOTE — Transfer of Care (Signed)
Immediate Anesthesia Transfer of Care Note  Patient: Billy Blevins  Procedure(s) Performed: Procedure(s): Lumbar five-Sacrum one  Laminectomy with left facetectomy, Lumbar five-Sacrum one  Posterior lumbar interbody fusion, Lumbar five-Sacrum one  fixation/fusion with Mazor robot (N/A) APPLICATION OF ROBOTIC ASSISTANCE FOR SPINAL PROCEDURE (N/A)  Patient Location: PACU  Anesthesia Type:General  Level of Consciousness: awake, alert  and patient cooperative  Airway & Oxygen Therapy: Patient Spontanous Breathing  Post-op Assessment: Report given to RN and Post -op Vital signs reviewed and stable  Post vital signs: Reviewed and stable  Last Vitals:  Vitals:   06/12/16 1028  BP: 116/70  Pulse: 63  Resp: 20  Temp: 36.8 C    Last Pain:  Vitals:   06/12/16 1023  PainSc: 7       Patients Stated Pain Goal: 3 ([id number])  Complications: No apparent anesthesia complications

## 2016-06-12 NOTE — Anesthesia Procedure Notes (Signed)
Procedure Name: Intubation Date/Time: 06/12/2016 12:32 PM Performed by: Lance Coon Pre-anesthesia Checklist: Patient identified, Emergency Drugs available, Suction available, Patient being monitored and Timeout performed Patient Re-evaluated:Patient Re-evaluated prior to inductionOxygen Delivery Method: Circle system utilized Preoxygenation: Pre-oxygenation with 100% oxygen Intubation Type: IV induction Ventilation: Mask ventilation without difficulty Laryngoscope Size: Mac and 4 Grade View: Grade I Tube type: Oral Tube size: 7.5 mm Number of attempts: 1 Airway Equipment and Method: Stylet Placement Confirmation: ETT inserted through vocal cords under direct vision,  positive ETCO2 and breath sounds checked- equal and bilateral Secured at: 22 cm Tube secured with: Tape Dental Injury: Teeth and Oropharynx as per pre-operative assessment  Comments: Airwar per P. Patel RN/Paramedic student

## 2016-06-12 NOTE — H&P (Signed)
CC:  No chief complaint on file. Back and leg pain  HPI: Billy Blevins is a 41 y.o. male with left L5 radiculopathy presents for left L5-S1 facetectomy with posterior lumbar interbody fusion.  No changes since clinic.  PMH: Past Medical History:  Diagnosis Date  . Allergic rhinitis, seasonal   . Chronic back pain    degenerative   . GERD (gastroesophageal reflux disease)   . History of head injury    TREE FALL ON HEAD  . History of kidney stones   . History of mononucleosis    2009  . Hyperactive gag reflex   . Hyperlipidemia   . Hypertension   . Irregular heart rhythm   . PONV (postoperative nausea and vomiting)    after eye sx as child    PSH: Past Surgical History:  Procedure Laterality Date  . ADENOIDECTOMY  as child  . EYE SURGERY     lasix  . LUMBAR LAMINECTOMY/DECOMPRESSION MICRODISCECTOMY  07/09/2011   Procedure: LUMBAR LAMINECTOMY/DECOMPRESSION MICRODISCECTOMY;  Surgeon: Melina Schools, MD;  Location: [place];  Service: Orthopedics;  Laterality: N/A;  LEFT L5-S1 DISCECTOMY  . STRABISMUS SURGERY Left as child  . TONSILLECTOMY  09-09-2010  . VASECTOMY Bilateral 09/28/2015   Procedure: VASECTOMY;  Surgeon: Carolan Clines, MD;  Location: [facility];  Service: Urology;  Laterality: Bilateral;    SH: Social History  Substance Use Topics  . Smoking status: Former Smoker    Years: 12.00    Types: Cigarettes    Quit date: 09/24/2006  . Smokeless tobacco: Never Used     Comment: used vapors from 2008 until Sept 2016  . Alcohol use 1.2 oz/week    2 Glasses of wine per week     Comment: occasional    MEDS: Prior to Admission medications   Medication Sig Start Date End Date Taking? Authorizing Provider  aspirin EC 81 MG tablet Take 81 mg by mouth daily.   Yes Historical Provider, MD  cetirizine (ZYRTEC) 10 MG tablet Take 10 mg by mouth at bedtime.   Yes Historical Provider, MD  fluticasone (FLONASE) 50 MCG/ACT nasal spray Place 2 sprays into both  nostrils daily. 05/24/13  Yes Abner Greenspan, MD  HYDROcodone-acetaminophen (NORCO/VICODIN) 5-325 MG tablet Take 1 tablet by mouth every 6 (six) hours as needed for moderate pain.   Yes Historical Provider, MD  meloxicam (MOBIC) 15 MG tablet Take 1 tablet (15 mg total) by mouth daily. 09/28/15  Yes Carolan Clines, MD  metoprolol succinate (TOPROL-XL) 50 MG 24 hr tablet Take 50 mg by mouth daily. Take with or immediately following a meal.   Yes Historical Provider, MD  Multiple Vitamin (MULTIVITAMIN) tablet Take 1 tablet by mouth daily.     Yes Historical Provider, MD  omeprazole (PRILOSEC) 20 MG capsule TAKE 1 BY MOUTH DAILY Patient taking differently: Take 20 mg by mouth every evening. TAKE 1 BY MOUTH DAILY 06/22/14  Yes Abner Greenspan, MD  simvastatin (ZOCOR) 40 MG tablet Take 40 mg by mouth every evening.   Yes Historical Provider, MD  ibuprofen (ADVIL,MOTRIN) 200 MG tablet Take 200 mg by mouth every 6 (six) hours as needed.    Historical Provider, MD    ALLERGY: Allergies  Allergen Reactions  . No Known Allergies     ROS: ROS  NEUROLOGIC EXAM: Awake, alert, oriented Memory and concentration grossly intact Speech fluent, appropriate CN grossly intact Motor exam: Upper Extremities Deltoid Bicep Tricep Grip  Right 5/5 5/5 5/5 5/5  Left 5/5 5/5 5/5 5/5   Lower Extremity IP Quad PF DF EHL  Right 5/5 5/5 5/5 5/5 5/5  Left 5/5 5/5 5/5 5/5 5/5   Sensation grossly intact to LT  IMAGING: No new imaging.  IMPRESSION: - 41 y.o. male with severe left L5 foraminal stenosis and medically intractable pain.  PLAN: - Left L5-S1 facetectomy with L5-S1 posterior lumbar interbody fusion. - We have discussed the risks, benefits, and alternatives to surgery and he wishes to proceed.

## 2016-06-13 LAB — BASIC METABOLIC PANEL
Anion gap: 7 (ref 5–15)
BUN: 9 mg/dL (ref 6–20)
CO2: 27 mmol/L (ref 22–32)
Calcium: 8.9 mg/dL (ref 8.9–10.3)
Chloride: 102 mmol/L (ref 101–111)
Creatinine, Ser: 0.95 mg/dL (ref 0.61–1.24)
GFR calc Af Amer: >60 mL/min (ref >60)
Glucose, Bld: 157 mg/dL — ABNORMAL HIGH (ref 65–99)
POTASSIUM: 4.6 mmol/L (ref 3.5–5.1)
SODIUM: 136 mmol/L (ref 135–145)

## 2016-06-13 LAB — CBC
HCT: 33.4 % — ABNORMAL LOW (ref 39.0–52.0)
Hemoglobin: 11.7 g/dL — ABNORMAL LOW (ref 13.0–17.0)
MCH: 29 pg (ref 26.0–34.0)
MCHC: 35 g/dL (ref 30.0–36.0)
MCV: 82.7 fL (ref 78.0–100.0)
PLATELETS: 136 10*3/uL — AB (ref 150–400)
RBC: 4.04 MIL/uL — AB (ref 4.22–5.81)
RDW: 12.7 % (ref 11.5–15.5)
WBC: 9.7 10*3/uL (ref 4.0–10.5)

## 2016-06-13 MED ORDER — HYDROCODONE-ACETAMINOPHEN 5-325 MG PO TABS: 1.0000 | ORAL_TABLET | ORAL | 0 refills | Status: DC | PRN

## 2016-06-13 MED ORDER — METHOCARBAMOL 750 MG PO TABS: 750.0000 mg | ORAL_TABLET | Freq: Three times a day (TID) | ORAL | 1 refills | Status: DC | PRN

## 2016-06-13 MED ORDER — GABAPENTIN 300 MG PO CAPS: 300.0000 mg | ORAL_CAPSULE | Freq: Three times a day (TID) | ORAL | 2 refills | Status: AC

## 2016-06-13 NOTE — Evaluation (Signed)
Physical Therapy Evaluation Patient Details Name: Billy Blevins MRN: [id number] DOB: 1975/06/27 Today's Date: 06/13/2016   History of Present Illness  Pt is a 41 y/o male who presents s/p L5-S1 posterior lumbar fusion on 06/12/16.  Clinical Impression  Patient evaluated by Physical Therapy with no further acute PT needs identified. All education has been completed and the patient has no further questions. At the time of PT eval pt was able to perform transfers and ambulation with modified independence. Pt was educated on walking program, precautions, car transfer, and general safety at home.  See below for any follow-up Physical Therapy or equipment needs. PT is signing off. Thank you for this referral.     Follow Up Recommendations Outpatient PT;Supervision for mobility/OOB    Equipment Recommendations  3in1 (PT)    Recommendations for Other Services       Precautions / Restrictions Precautions Precautions: Fall;Back Precaution Booklet Issued: Yes (comment) Precaution Comments: Reviewed in detail with pt and wife. Pt was cued for precautions during functional mobility.  Restrictions Weight Bearing Restrictions: No      Mobility  Bed Mobility               General bed mobility comments: Pt sitting EOB when PT arrived. Reviewed log roll.   Transfers Overall transfer level: Modified independent Equipment used: None Transfers: Sit to/from Stand              Ambulation/Gait Ambulation/Gait assistance: Modified independent (Device/Increase time) Ambulation Distance (Feet): 450 Feet Assistive device: None Gait Pattern/deviations: Step-through pattern;Decreased stride length;Trunk flexed Gait velocity: Decreased Gait velocity interpretation: Below normal speed for age/gender General Gait Details: VC's for improved posture. Noted LLE externally rotated during ambulation.   Stairs Stairs: Yes Stairs assistance: Modified independent (Device/Increase time) Stair  Management: One rail Left;Step to pattern;Forwards Number of Stairs: 10    Wheelchair Mobility    Modified Rankin (Stroke Patients Only)       Balance Overall balance assessment: No apparent balance deficits (not formally assessed)                                           Pertinent Vitals/Pain Pain Assessment: 0-10 Pain Score: 5  Pain Location: Incision site Pain Descriptors / Indicators: Operative site guarding;Discomfort Pain Intervention(s): Limited activity within patient's tolerance;Monitored during session;Repositioned    Home Living Family/patient expects to be discharged to:: Private residence Living Arrangements: Spouse/significant other   Type of Home: House Home Access: Stairs to enter Entrance Stairs-Rails: Psychiatric nurse of Steps: 5 Home Layout: One level Home Equipment: None      Prior Function Level of Independence: Independent               Hand Dominance   Dominant Hand: Right    Extremity/Trunk Assessment   Upper Extremity Assessment Upper Extremity Assessment: Defer to OT evaluation    Lower Extremity Assessment Lower Extremity Assessment: LLE deficits/detail LLE Deficits / Details: Decreased strength consistent with pre-op diagnosis. Noted externally rotated LE during ambulation.     Cervical / Trunk Assessment Cervical / Trunk Assessment: Other exceptions Cervical / Trunk Exceptions: s/p surgery   Communication   Communication: No difficulties  Cognition Arousal/Alertness: Awake/alert Behavior During Therapy: WFL for tasks assessed/performed Overall Cognitive Status: Within Functional Limits for tasks assessed  General Comments      Exercises     Assessment/Plan    PT Assessment Patent does not need any further PT services  PT Problem List         PT Treatment Interventions      PT Goals (Current goals can be found in the Care Plan section)  Acute  Rehab PT Goals Patient Stated Goal: Home today PT Goal Formulation: All assessment and education complete, DC therapy    Frequency     Barriers to discharge        Co-evaluation               End of Session Equipment Utilized During Treatment: Gait belt Activity Tolerance: Patient tolerated treatment well Patient left: in chair;with call bell/phone within reach;with family/visitor present Nurse Communication: Mobility status PT Visit Diagnosis: Other abnormalities of gait and mobility (R26.89);Muscle weakness (generalized) (M62.81)         Time: [id number] PT Time Calculation (min) (ACUTE ONLY): 21 min   Charges:   PT Evaluation $PT Eval Moderate Complexity: 1 Procedure     PT G Codes:         Thelma Comp 06/13/2016, 10:17 AM   Rolinda Roan, PT, DPT Acute Rehabilitation Services Pager: [phone]

## 2016-06-13 NOTE — Discharge Summary (Signed)
Date of Admission: 06/12/2016  Date of Discharge: 06/13/16  PRE-OPERATIVE DIAGNOSIS:  Lumbosacral spondylosis with radiculopathy, prior laminotomy left L5-S1  POST-OPERATIVE DIAGNOSIS:  Same  PROCEDURE:  Posterior lumbar interbody fusion L5-S1; Re-operative laminectomy left L5-S1, laminectomy right L5-S1; facetectomy left L5-S1 for decompression; use of BMP  Attending: Kevan Ny Ditty, MD  Hospital Course:  The patient was admitted for the above listed operation and had an uncomplicated post-operative course.  They were discharged in stable condition. Able to move all extremities. Up ambulating. Tolerating po. Wound without signs of infection. Tolerating pain well.  Follow up: 3 weeks for wound recheck  Allergies as of 06/13/2016      Reactions   No Known Allergies       Medication List    STOP taking these medications   ibuprofen 200 MG tablet Commonly known as:  ADVIL,MOTRIN     TAKE these medications   aspirin EC 81 MG tablet Take 81 mg by mouth daily.   cetirizine 10 MG tablet Commonly known as:  ZYRTEC Take 10 mg by mouth at bedtime.   fluticasone 50 MCG/ACT nasal spray Commonly known as:  FLONASE Place 2 sprays into both nostrils daily.   gabapentin 300 MG capsule Commonly known as:  NEURONTIN Take 1 capsule (300 mg total) by mouth 3 (three) times daily.   HYDROcodone-acetaminophen 5-325 MG tablet Commonly known as:  NORCO/VICODIN Take 1-2 tablets by mouth every 4 (four) hours as needed for moderate pain. What changed:  how much to take  when to take this   meloxicam 15 MG tablet Commonly known as:  MOBIC Take 1 tablet (15 mg total) by mouth daily.   methocarbamol 750 MG tablet Commonly known as:  ROBAXIN-750 Take 1 tablet (750 mg total) by mouth 3 (three) times daily as needed for muscle spasms.   metoprolol succinate 50 MG 24 hr tablet Commonly known as:  TOPROL-XL Take 50 mg by mouth daily. Take with or immediately following a meal.    multivitamin tablet Take 1 tablet by mouth daily.   omeprazole 20 MG capsule Commonly known as:  PRILOSEC TAKE 1 BY MOUTH DAILY What changed:  how much to take  how to take this  when to take this  additional instructions   simvastatin 40 MG tablet Commonly known as:  ZOCOR Take 40 mg by mouth every evening.

## 2016-06-13 NOTE — Progress Notes (Signed)
Patient is discharged from room 3C09 at this time. Alert and in stable condition. IV site d/cd and instructions read to patient with understanding verbalized. Left unit via wheelchair with wife and all belongings at side.

## 2016-06-16 ENCOUNTER — Encounter (HOSPITAL_COMMUNITY): Payer: Self-pay | Admitting: Neurological Surgery

## 2016-06-20 ENCOUNTER — Ambulatory Visit (HOSPITAL_COMMUNITY): Payer: 59

## 2017-02-15 ENCOUNTER — Emergency Department (HOSPITAL_COMMUNITY)
Admission: EM | Admit: 2017-02-15 | Discharge: 2017-02-15 | Disposition: A | Discharge status: Home / Self Care | Payer: 59 | Attending: Emergency Medicine | Admitting: Emergency Medicine

## 2017-02-15 ENCOUNTER — Encounter (HOSPITAL_COMMUNITY): Payer: Self-pay | Admitting: Emergency Medicine

## 2017-02-15 DIAGNOSIS — Y929 Unspecified place or not applicable: Secondary | ICD-10-CM | POA: Insufficient documentation

## 2017-02-15 DIAGNOSIS — W228XXA Striking against or struck by other objects, initial encounter: Secondary | ICD-10-CM | POA: Insufficient documentation

## 2017-02-15 DIAGNOSIS — Z87891 Personal history of nicotine dependence: Secondary | ICD-10-CM | POA: Insufficient documentation

## 2017-02-15 DIAGNOSIS — I10 Essential (primary) hypertension: Secondary | ICD-10-CM | POA: Insufficient documentation

## 2017-02-15 DIAGNOSIS — Z7982 Long term (current) use of aspirin: Secondary | ICD-10-CM | POA: Diagnosis not present

## 2017-02-15 DIAGNOSIS — Y999 Unspecified external cause status: Secondary | ICD-10-CM | POA: Diagnosis not present

## 2017-02-15 DIAGNOSIS — Z79899 Other long term (current) drug therapy: Secondary | ICD-10-CM | POA: Diagnosis not present

## 2017-02-15 DIAGNOSIS — S99921A Unspecified injury of right foot, initial encounter: Secondary | ICD-10-CM | POA: Diagnosis present

## 2017-02-15 DIAGNOSIS — S91209A Unspecified open wound of unspecified toe(s) with damage to nail, initial encounter: Secondary | ICD-10-CM

## 2017-02-15 DIAGNOSIS — S91201A Unspecified open wound of right great toe with damage to nail, initial encounter: Secondary | ICD-10-CM | POA: Insufficient documentation

## 2017-02-15 DIAGNOSIS — Y9389 Activity, other specified: Secondary | ICD-10-CM | POA: Diagnosis not present

## 2017-02-15 MED ORDER — LIDOCAINE HCL (PF) 1 % IJ SOLN
5.0000 mL | Freq: Once | INTRAMUSCULAR | Status: AC
  Administered 2017-02-15: 5 mL
  Filled 2017-02-15: qty 5

## 2017-02-15 NOTE — ED Triage Notes (Signed)
Pt was cleaning his yard and the machine slide off and hit him on his right great toe, pull nail back completed, pt denies any pain at this time.

## 2017-02-15 NOTE — ED Provider Notes (Signed)
[place] EMERGENCY DEPARTMENT Provider Note   CSN: [id number] Arrival date & time: 02/15/17  1929     History   Chief Complaint Chief Complaint  Patient presents with  . Toe Injury    HPI Billy Blevins is a 41 y.o. male.   Presents for evaluation of injury to great toe, earlier today.  He was pulling a wood container, when it accidentally lifted his toenail up.  Nothing dropped onto the toe.  No other injuries.  He thinks his last tetanus booster was 3 years ago when he had a leg injury.  There are no other known modifying factors.  HPI  Past Medical History:  Diagnosis Date  . Allergic rhinitis, seasonal   . Chronic back pain    degenerative   . GERD (gastroesophageal reflux disease)   . History of head injury    TREE FALL ON HEAD  . History of kidney stones   . History of mononucleosis    2009  . Hyperactive gag reflex   . Hyperlipidemia   . Hypertension   . Irregular heart rhythm   . PONV (postoperative nausea and vomiting)    after eye sx as child    Patient Active Problem List   Diagnosis Date Noted  . Lumbosacral spondylosis with radiculopathy 06/12/2016  . Routine general medical examination at a health care facility 05/16/2013  . Lumbar degenerative disc disease 05/28/2011  . Tachycardia 05/28/2011  . CARPAL TUNNEL SYNDROME 08/17/2008  . RHINITIS 08/17/2008  . HYPERLIPIDEMIA 02/14/2008  . G E R D 01/01/2007    Past Surgical History:  Procedure Laterality Date  . ADENOIDECTOMY  as child  . APPLICATION OF ROBOTIC ASSISTANCE FOR SPINAL PROCEDURE N/A 06/12/2016   Procedure: APPLICATION OF ROBOTIC ASSISTANCE FOR SPINAL PROCEDURE;  Surgeon: Kevan Ny Ditty, MD;  Location: [place];  Service: Neurosurgery;  Laterality: N/A;  . EYE SURGERY     lasix  . LUMBAR LAMINECTOMY/DECOMPRESSION MICRODISCECTOMY  07/09/2011   Procedure: LUMBAR LAMINECTOMY/DECOMPRESSION MICRODISCECTOMY;  Surgeon: Melina Schools, MD;  Location: [place];  Service:  Orthopedics;  Laterality: N/A;  LEFT L5-S1 DISCECTOMY  . STRABISMUS SURGERY Left as child  . TONSILLECTOMY  09-09-2010  . VASECTOMY Bilateral 09/28/2015   Procedure: VASECTOMY;  Surgeon: Carolan Clines, MD;  Location: [facility];  Service: Urology;  Laterality: Bilateral;       Home Medications    Prior to Admission medications   Medication Sig Start Date End Date Taking? Authorizing Provider  aspirin EC 81 MG tablet Take 81 mg by mouth daily.    [provider]  cetirizine (ZYRTEC) 10 MG tablet Take 10 mg by mouth at bedtime.    [provider]  fluticasone (FLONASE) 50 MCG/ACT nasal spray Place 2 sprays into both nostrils daily. 05/24/13   Tower, Wynelle Fanny, MD  gabapentin (NEURONTIN) 300 MG capsule Take 1 capsule (300 mg total) by mouth 3 (three) times daily. 06/13/16 06/13/17  Costella, Vista Mink, PA-C  HYDROcodone-acetaminophen (NORCO/VICODIN) 5-325 MG tablet Take 1-2 tablets by mouth every 4 (four) hours as needed for moderate pain. 06/13/16   Costella, Vista Mink, PA-C  meloxicam (MOBIC) 15 MG tablet Take 1 tablet (15 mg total) by mouth daily. 09/28/15   Carolan Clines, MD  methocarbamol (ROBAXIN-750) 750 MG tablet Take 1 tablet (750 mg total) by mouth 3 (three) times daily as needed for muscle spasms. 06/13/16   Costella, Vista Mink, PA-C  metoprolol succinate (TOPROL-XL) 50 MG 24 hr tablet Take  50 mg by mouth daily. Take with or immediately following a meal.    [provider]  Multiple Vitamin (MULTIVITAMIN) tablet Take 1 tablet by mouth daily.      [provider]  omeprazole (PRILOSEC) 20 MG capsule TAKE 1 BY MOUTH DAILY Patient taking differently: Take 20 mg by mouth every evening. TAKE 1 BY MOUTH DAILY 06/22/14   Tower, Wynelle Fanny, MD  simvastatin (ZOCOR) 40 MG tablet Take 40 mg by mouth every evening.    [provider]    Family History Family History  Problem Relation Age of Onset  . Hyperlipidemia Mother        diet  controlled  . Heart disease Father        CAD/ MI x 2  . Diabetes Father   . Parkinsonism Father   . Anesthesia problems Neg Hx   . Hypotension Neg Hx   . Malignant hyperthermia Neg Hx   . Pseudochol deficiency Neg Hx     Social History Social History  Substance Use Topics  . Smoking status: Former Smoker    Years: 12.00    Types: Cigarettes    Quit date: 09/24/2006  . Smokeless tobacco: Never Used     Comment: used vapors from 2008 until Sept 2016  . Alcohol use 1.2 oz/week    2 Glasses of wine per week     Comment: occasional     Allergies   No known allergies   Review of Systems Review of Systems  All other systems reviewed and are negative.    Physical Exam Updated Vital Signs BP 134/88 (BP Location: Right Arm)   Pulse 92   Temp 98 F (36.7 C) (Oral)   Resp 18   Ht 5\' 10"  (1.778 m)   Wt 90.7 kg (200 lb)   SpO2 96%   BMI 28.70 kg/m   Physical Exam  Constitutional: He is oriented to person, place, and time. He appears well-developed and well-nourished.  HENT:  Head: Normocephalic and atraumatic.  Right Ear: External ear normal.  Left Ear: External ear normal.  Eyes: Pupils are equal, round, and reactive to light. Conjunctivae and EOM are normal.  Neck: Normal range of motion and phonation normal. Neck supple.  Cardiovascular: Normal rate.   Pulmonary/Chest: Effort normal. He exhibits no bony tenderness.  Musculoskeletal:  Right great toe with partially avulsed nail.  Nail bed exposed, bleeding and clot present preventing evaluation for extent of injury.  Neurological: He is alert and oriented to person, place, and time. No cranial nerve deficit or sensory deficit. He exhibits normal muscle tone. Coordination normal.  Skin: Skin is warm, dry and intact.  Psychiatric: He has a normal mood and affect. His behavior is normal. Judgment and thought content normal.  Nursing note and vitals reviewed.    ED Treatments / Results  Labs (all labs ordered are  listed, but only abnormal results are displayed) Labs Reviewed - No data to display  EKG  EKG Interpretation None       Radiology No results found.  Procedures .Nerve Block Date/Time: 02/15/2017 8:29 PM Performed by: Daleen Bo Authorized by: Daleen Bo   Consent:    Consent obtained:  Verbal   Consent given by:  Patient Indications:    Indications:  Pain relief Location:    Nerve block body site: Right great toe, digital block. Pre-procedure details:    Skin preparation:  Povidone-iodine Skin anesthesia (see MAR for exact dosages):    Skin anesthesia method:  None Procedure details (see MAR for exact dosages):    Block needle gauge:  24 G   Injection procedure:  Anatomic landmarks identified   Paresthesia:  Immediately resolved Post-procedure details:    Dressing:  None   Outcome:  Anesthesia achieved   Patient tolerance of procedure:  Tolerated well, no immediate complications Wound repair Date/Time: 02/15/2017 8:31 PM Performed by: Daleen Bo Authorized by: Daleen Bo  Consent: Verbal consent obtained. Risks and benefits: risks, benefits and alternatives were discussed Consent given by: patient Patient understanding: patient states understanding of the procedure being performed Patient consent: the patient's understanding of the procedure matches consent given Site marked: the operative site was not marked Imaging studies: imaging studies not available Patient identity confirmed: verbally with patient Preparation: Patient was prepped and draped in the usual sterile fashion. Local anesthesia used: yes Anesthesia: digital block  Anesthesia: Local anesthesia used: yes Local Anesthetic: lidocaine 1% without epinephrine Anesthetic total: 4 mL  Sedation: Patient sedated: no Patient tolerance: Patient tolerated the procedure well with no immediate complications Comments: Near complete toenail avulsion, right great toe, removed, by elevation of  cuticle and nail, at the base.  Toe cleansed with Betadine and irrigated with saline.  There is no laceration of the nailbed or tissue adjacent to the nailbed.  After cleansing, and trimming, the old nail was replaced into the cuticle.  A dressing using gauze and Coban was applied.  Wound care instructions given to family and patient, by me.    (including critical care time)  Medications Ordered in ED Medications  lidocaine (PF) (XYLOCAINE) 1 % injection 5 mL (5 mLs Infiltration Given by Other 02/15/17 2017)     Initial Impression / Assessment and Plan / ED Course  I have reviewed the triage vital signs and the nursing notes.  Pertinent labs & imaging results that were available during my care of the patient were reviewed by me and considered in my medical decision making (see chart for details).      Patient Vitals for the past 24 hrs:  BP Temp Temp src Pulse Resp SpO2 Height Weight  02/15/17 1949 134/88 98 F (36.7 C) Oral 92 18 96 % 5\' 10"  (1.778 m) 90.7 kg (200 lb)    8:28 PM Reevaluation with update and discussion. After initial assessment and treatment, an updated evaluation reveals patient feels better at this time.  Findings discussed with the patient and his wife, all questions were answered. Billy Blevins      Final Clinical Impressions(s) / ED Diagnoses   Final diagnoses:  Avulsion of toenail, initial encounter    Toenail avulsion, wound treatment consisted of complete removal of nail, then preparing it to be placed back into the cuticle, and wound dressing.  Doubt fracture, significant laceration or risk for infection.  Nursing Notes Reviewed/ Care Coordinated Applicable Imaging Reviewed Interpretation of Laboratory Data incorporated into ED treatment  The patient appears reasonably screened and/or stabilized for discharge and I doubt any other medical condition or other [facility] requiring further screening, evaluation, or treatment in the ED at this time prior to  discharge.  Plan: Home Medications-continue usual medications, ibuprofen for pain; Home Treatments-wound care at home; return here if the recommended treatment, does not improve the symptoms; Recommended follow up-PCP, as needed   New Prescriptions New Prescriptions   No medications on file     Daleen Bo, MD 02/15/17 2102

## 2017-02-15 NOTE — Discharge Instructions (Signed)
We have cleaned and placed the nail back into the cuticle.  Remove the dressing tomorrow evening then clean daily with soap and water, until the new toenail pushes the old one out.  Return here or see your doctor as needed for problems.  It will help to elevate the foot, to decrease pain, and take ibuprofen if needed.

## 2017-07-03 ENCOUNTER — Emergency Department (HOSPITAL_COMMUNITY): Payer: 59

## 2017-07-03 ENCOUNTER — Emergency Department (HOSPITAL_COMMUNITY)
Admission: EM | Admit: 2017-07-03 | Discharge: 2017-07-03 | Disposition: A | Discharge status: Home / Self Care | Payer: 59 | Attending: Emergency Medicine | Admitting: Emergency Medicine

## 2017-07-03 ENCOUNTER — Encounter (HOSPITAL_COMMUNITY): Payer: Self-pay | Admitting: Emergency Medicine

## 2017-07-03 ENCOUNTER — Other Ambulatory Visit: Payer: Self-pay

## 2017-07-03 DIAGNOSIS — R110 Nausea: Secondary | ICD-10-CM | POA: Insufficient documentation

## 2017-07-03 DIAGNOSIS — Z79899 Other long term (current) drug therapy: Secondary | ICD-10-CM | POA: Diagnosis not present

## 2017-07-03 DIAGNOSIS — F17210 Nicotine dependence, cigarettes, uncomplicated: Secondary | ICD-10-CM | POA: Insufficient documentation

## 2017-07-03 DIAGNOSIS — I10 Essential (primary) hypertension: Secondary | ICD-10-CM | POA: Insufficient documentation

## 2017-07-03 DIAGNOSIS — R1032 Left lower quadrant pain: Secondary | ICD-10-CM | POA: Diagnosis not present

## 2017-07-03 DIAGNOSIS — Z7982 Long term (current) use of aspirin: Secondary | ICD-10-CM | POA: Diagnosis not present

## 2017-07-03 LAB — COMPREHENSIVE METABOLIC PANEL
ALT: 28 U/L (ref 17–63)
AST: 20 U/L (ref 15–41)
Albumin: 4.4 g/dL (ref 3.5–5.0)
Alkaline Phosphatase: 46 U/L (ref 38–126)
Anion gap: 9 (ref 5–15)
BILIRUBIN TOTAL: 0.5 mg/dL (ref 0.3–1.2)
BUN: 15 mg/dL (ref 6–20)
CO2: 27 mmol/L (ref 22–32)
CREATININE: 0.92 mg/dL (ref 0.61–1.24)
Calcium: 9.1 mg/dL (ref 8.9–10.3)
Chloride: 102 mmol/L (ref 101–111)
GFR calc Af Amer: >60 mL/min (ref >60)
GFR calc non Af Amer: >60 mL/min (ref >60)
Glucose, Bld: 122 mg/dL — ABNORMAL HIGH (ref 65–99)
Potassium: 3.9 mmol/L (ref 3.5–5.1)
Sodium: 138 mmol/L (ref 135–145)
TOTAL PROTEIN: 6.8 g/dL (ref 6.5–8.1)

## 2017-07-03 LAB — CBC
HEMATOCRIT: 42.7 % (ref 39.0–52.0)
Hemoglobin: 14.8 g/dL (ref 13.0–17.0)
MCH: 30.3 pg (ref 26.0–34.0)
MCHC: 34.7 g/dL (ref 30.0–36.0)
MCV: 87.5 fL (ref 78.0–100.0)
Platelets: 144 10*3/uL — ABNORMAL LOW (ref 150–400)
RBC: 4.88 MIL/uL (ref 4.22–5.81)
RDW: 13.3 % (ref 11.5–15.5)
WBC: 6.8 10*3/uL (ref 4.0–10.5)

## 2017-07-03 LAB — URINALYSIS, ROUTINE W REFLEX MICROSCOPIC
BILIRUBIN URINE: NEGATIVE
Glucose, UA: NEGATIVE mg/dL
HGB URINE DIPSTICK: NEGATIVE
Ketones, ur: NEGATIVE mg/dL
Leukocytes, UA: NEGATIVE
Nitrite: NEGATIVE
PROTEIN: NEGATIVE mg/dL
SPECIFIC GRAVITY, URINE: 1.019 (ref 1.005–1.030)
pH: 6 (ref 5.0–8.0)

## 2017-07-03 MED ORDER — ONDANSETRON HCL 4 MG PO TABS: 4.0000 mg | ORAL_TABLET | Freq: Three times a day (TID) | ORAL | 0 refills | Status: AC | PRN

## 2017-07-03 MED ORDER — IOPAMIDOL (ISOVUE-300) INJECTION 61%
INTRAVENOUS | Status: AC
  Administered 2017-07-03: 100 mL
  Filled 2017-07-03: qty 100

## 2017-07-03 MED ORDER — ONDANSETRON HCL 4 MG PO TABS: 4.0000 mg | ORAL_TABLET | Freq: Three times a day (TID) | ORAL | 0 refills | Status: DC | PRN

## 2017-07-03 MED ORDER — HYDROCODONE-ACETAMINOPHEN 5-325 MG PO TABS
1.0000 | ORAL_TABLET | Freq: Once | ORAL | Status: AC
  Administered 2017-07-03: 1 via ORAL
  Filled 2017-07-03: qty 1

## 2017-07-03 MED ORDER — TRAMADOL HCL 50 MG PO TABS: 50.0000 mg | ORAL_TABLET | Freq: Two times a day (BID) | ORAL | 0 refills | Status: DC | PRN

## 2017-07-03 MED ORDER — ACETAMINOPHEN 500 MG PO TABS: 1000.0000 mg | ORAL_TABLET | Freq: Three times a day (TID) | ORAL | 0 refills | Status: AC

## 2017-07-03 MED ORDER — FENTANYL CITRATE (PF) 100 MCG/2ML IJ SOLN
25.0000 ug | Freq: Once | INTRAMUSCULAR | Status: AC
  Administered 2017-07-03: 25 ug via INTRAVENOUS
  Filled 2017-07-03: qty 2

## 2017-07-03 MED ORDER — TRAMADOL HCL 50 MG PO TABS: 50.0000 mg | ORAL_TABLET | Freq: Two times a day (BID) | ORAL | 0 refills | Status: AC | PRN

## 2017-07-03 MED ORDER — ONDANSETRON HCL 4 MG/2ML IJ SOLN
4.0000 mg | Freq: Once | INTRAMUSCULAR | Status: AC
  Administered 2017-07-03: 4 mg via INTRAVENOUS
  Filled 2017-07-03: qty 2

## 2017-07-03 MED ORDER — KETOROLAC TROMETHAMINE 15 MG/ML IJ SOLN
15.0000 mg | Freq: Once | INTRAMUSCULAR | Status: AC
  Administered 2017-07-03: 15 mg via INTRAVENOUS
  Filled 2017-07-03: qty 1

## 2017-07-03 MED ORDER — MORPHINE SULFATE (PF) 4 MG/ML IV SOLN
4.0000 mg | Freq: Once | INTRAVENOUS | Status: AC
  Administered 2017-07-03: 4 mg via INTRAVENOUS
  Filled 2017-07-03: qty 1

## 2017-07-03 NOTE — Discharge Instructions (Signed)
Use Zofran as needed for nausea or vomiting. Take scheduled Tylenol, 1000 mg, 3 times a day.  You may supplement with ibuprofen. Use tramadol as needed for severe or sharp pain. Follow-up with the GI doctor next week for further evaluation. To the emergency room if you develop persistent vomiting, worsening pain, fevers, or any new or concerning symptoms.

## 2017-07-03 NOTE — ED Notes (Signed)
IV removed from LAC 

## 2017-07-03 NOTE — ED Provider Notes (Signed)
[facility] DEPT Provider Note   CSN: [id number] Arrival date & time: 07/03/17  0557     History   Chief Complaint Chief Complaint  Patient presents with  . Flank Pain    HPI Billy Blevins is a 42 y.o. male presenting for evaluation of left lower quadrant abdominal pain.  Patient states pain began acutely last night around 1030.  It is described as a sharp pain which radiates to his back.  Additionally, the pain radiates into his left testicle.  He denies testicular swelling.  He denies penile discharge.  He denies history of similar.  Pain improved mildly with some Tylenol and then returned.  Nothing makes it better or worse.  He reports nausea without vomiting.  He denies pain elsewhere in his abdomen.  He denies fevers, chills, chest pain, shortness of breath, urinary symptoms, abnormal bowel movements.  He denies history of abdominal problems or history of abdominal surgeries.  Has a history of kidney stones, although states this feels different  HPI  Past Medical History:  Diagnosis Date  . Allergic rhinitis, seasonal   . Chronic back pain    degenerative   . GERD (gastroesophageal reflux disease)   . History of head injury    TREE FALL ON HEAD  . History of kidney stones   . History of mononucleosis    2009  . Hyperactive gag reflex   . Hyperlipidemia   . Hypertension   . Irregular heart rhythm   . PONV (postoperative nausea and vomiting)    after eye sx as child    Patient Active Problem List   Diagnosis Date Noted  . Lumbosacral spondylosis with radiculopathy 06/12/2016  . Routine general medical examination at a health care facility 05/16/2013  . Lumbar degenerative disc disease 05/28/2011  . Tachycardia 05/28/2011  . CARPAL TUNNEL SYNDROME 08/17/2008  . RHINITIS 08/17/2008  . HYPERLIPIDEMIA 02/14/2008  . G E R D 01/01/2007    Past Surgical History:  Procedure Laterality Date  . ADENOIDECTOMY  as child  . APPLICATION OF  ROBOTIC ASSISTANCE FOR SPINAL PROCEDURE N/A 06/12/2016   Procedure: APPLICATION OF ROBOTIC ASSISTANCE FOR SPINAL PROCEDURE;  Surgeon: Kevan Ny Ditty, MD;  Location: [facility];  Service: Neurosurgery;  Laterality: N/A;  . EYE SURGERY     lasix  . LUMBAR LAMINECTOMY/DECOMPRESSION MICRODISCECTOMY  07/09/2011   Procedure: LUMBAR LAMINECTOMY/DECOMPRESSION MICRODISCECTOMY;  Surgeon: Melina Schools, MD;  Location: [facility];  Service: Orthopedics;  Laterality: N/A;  LEFT L5-S1 DISCECTOMY  . STRABISMUS SURGERY Left as child  . TONSILLECTOMY  09-09-2010  . VASECTOMY Bilateral 09/28/2015   Procedure: VASECTOMY;  Surgeon: Carolan Clines, MD;  Location: [facility];  Service: Urology;  Laterality: Bilateral;       Home Medications    Prior to Admission medications   Medication Sig Start Date End Date Taking? Authorizing Provider  aspirin EC 81 MG tablet Take 81 mg by mouth daily.   Yes [provider]  cetirizine (ZYRTEC) 10 MG tablet Take 10 mg by mouth at bedtime.   Yes [provider]  diclofenac (VOLTAREN) 50 MG EC tablet Take 50 mg by mouth daily.   Yes [provider]  fluticasone (FLONASE) 50 MCG/ACT nasal spray Place 2 sprays into both nostrils daily. 05/24/13  Yes Tower, Wynelle Fanny, MD  gabapentin (NEURONTIN) 300 MG capsule Take 1 capsule (300 mg total) by mouth 3 (three) times daily. Patient taking differently: Take 300 mg by mouth at bedtime.  06/13/16 07/03/17 Yes Costella, Vista Mink, PA-C  methocarbamol (ROBAXIN-750) 750 MG tablet Take 1 tablet (750 mg total) by mouth 3 (three) times daily as needed for muscle spasms. 06/13/16  Yes Costella, Vista Mink, PA-C  metoprolol succinate (TOPROL-XL) 50 MG 24 hr tablet Take 50 mg by mouth daily. Take with or immediately following a meal.   Yes [provider]  Multiple Vitamin (MULTIVITAMIN) tablet Take 1 tablet by mouth daily.     Yes [provider]  omeprazole (PRILOSEC) 20 MG capsule TAKE 1 BY  MOUTH DAILY Patient taking differently: Take 20 mg by mouth every evening. TAKE 1 BY MOUTH DAILY 06/22/14  Yes Tower, Wynelle Fanny, MD  Pseudoephedrine-Ibuprofen (ADVIL COLD/SINUS) 30-200 MG TABS Take 1 tablet by mouth 3 (three) times daily as needed (cold symptoms).   Yes [provider]  simvastatin (ZOCOR) 40 MG tablet Take 40 mg by mouth every evening.   Yes [provider]  acetaminophen (TYLENOL) 500 MG tablet Take 2 tablets (1,000 mg total) by mouth 3 (three) times daily. 07/03/17   [name], PA-C  HYDROcodone-acetaminophen (NORCO/VICODIN) 5-325 MG tablet Take 1-2 tablets by mouth every 4 (four) hours as needed for moderate pain. Patient not taking: Reported on 07/03/2017 06/13/16   Traci Sermon, PA-C  meloxicam (MOBIC) 15 MG tablet Take 1 tablet (15 mg total) by mouth daily. Patient not taking: Reported on 07/03/2017 09/28/15   Carolan Clines, MD  ondansetron (ZOFRAN) 4 MG tablet Take 1 tablet (4 mg total) by mouth every 8 (eight) hours as needed for nausea or vomiting. 07/03/17   [name], PA-C  traMADol (ULTRAM) 50 MG tablet Take 1 tablet (50 mg total) by mouth every 12 (twelve) hours as needed for severe pain. 07/03/17   [name], PA-C    Family History Family History  Problem Relation Age of Onset  . Hyperlipidemia Mother        diet controlled  . Heart disease Father        CAD/ MI x 2  . Diabetes Father   . Parkinsonism Father   . Anesthesia problems Neg Hx   . Hypotension Neg Hx   . Malignant hyperthermia Neg Hx   . Pseudochol deficiency Neg Hx     Social History Social History   Tobacco Use  . Smoking status: Current Every Day Smoker    Years: 12.00    Types: Cigarettes    Last attempt to quit: 09/24/2006    Years since quitting: 10.7  . Smokeless tobacco: Never Used  . Tobacco comment: used vapors from 2008 until Sept 2016  Substance Use Topics  . Alcohol use: Yes    Alcohol/week: 1.2 oz    Types: 2 Glasses of wine per  week    Comment: rare  . Drug use: No     Allergies   No known allergies   Review of Systems Review of Systems  Gastrointestinal: Positive for abdominal pain and nausea. Negative for vomiting.  All other systems reviewed and are negative.    Physical Exam Updated Vital Signs BP 128/81   Pulse 64   Temp 98 F (36.7 C) (Oral)   Resp 16   Ht 5\' 10"  (1.778 m)   Wt 86.2 kg (190 lb)   SpO2 96%   BMI 27.26 kg/m   Physical Exam  Constitutional: He is oriented to person, place, and time. He appears well-developed and well-nourished. No distress.  HENT:  Head: Normocephalic and atraumatic.  Eyes: Conjunctivae and  EOM are normal. Pupils are equal, round, and reactive to light.  Neck: Normal range of motion. Neck supple.  Cardiovascular: Normal rate, regular rhythm and intact distal pulses.  Pulmonary/Chest: Effort normal and breath sounds normal. No respiratory distress. He has no wheezes.  Abdominal: Soft. He exhibits no distension and no mass. There is tenderness. There is no guarding. Hernia confirmed negative in the right inguinal area.  Tenderness to palpation of left lower quadrant.  No hernia palpated.  No rigidity, guarding, or distention.  Genitourinary: Penis normal. Right testis shows no mass, no swelling and no tenderness. Left testis shows tenderness. Left testis shows no mass and no swelling. Uncircumcised.  Genitourinary Comments: Chaperone present.  Tenderness to palpation of the left testicle.  No obvious swelling or redness.  No inguinal hernia palpated.  Musculoskeletal: Normal range of motion.  Lymphadenopathy: No inguinal adenopathy noted on the right or left side.  Neurological: He is alert and oriented to person, place, and time.  Skin: Skin is warm and dry.  Psychiatric: He has a normal mood and affect.  Nursing note and vitals reviewed.    ED Treatments / Results  Labs (all labs ordered are listed, but only abnormal results are displayed) Labs  Reviewed  CBC - Abnormal; Notable for the following components:      Result Value   Platelets 144 (*)    All other components within normal limits  COMPREHENSIVE METABOLIC PANEL - Abnormal; Notable for the following components:   Glucose, Bld 122 (*)    All other components within normal limits  URINALYSIS, ROUTINE W REFLEX MICROSCOPIC    EKG  EKG Interpretation None       Radiology Ct Abdomen Pelvis W Contrast  Result Date: 07/03/2017 CLINICAL DATA:  Left-sided flank pain for several hours EXAM: CT ABDOMEN AND PELVIS WITH CONTRAST TECHNIQUE: Multidetector CT imaging of the abdomen and pelvis was performed using the standard protocol following bolus administration of intravenous contrast. CONTRAST:  115mL ISOVUE-300 IOPAMIDOL (ISOVUE-300) INJECTION 61% COMPARISON:  05/14/2016. FINDINGS: Lower chest: No acute abnormality. Hepatobiliary: No focal liver abnormality is seen. No gallstones, gallbladder wall thickening, or biliary dilatation. Pancreas: Unremarkable. No pancreatic ductal dilatation or surrounding inflammatory changes. Spleen: Normal in size without focal abnormality. Adrenals/Urinary Tract: Adrenal glands are within normal limits. Bilateral nonobstructing renal calculi are seen. 1 cm right renal cyst is again noted and stable. Bladder is within normal limits. Stomach/Bowel: Scattered diverticular change of the colon is noted. No findings to suggest diverticulitis are seen. The appendix is not well visualized. No inflammatory changes are identified. Vascular/Lymphatic: No significant vascular findings are present. No enlarged abdominal or pelvic lymph nodes. Reproductive: Prostate is unremarkable. Other: No abdominal wall hernia or abnormality. No abdominopelvic ascites. Musculoskeletal: Postsurgical changes in the lower lumbar spine. IMPRESSION: Bilateral renal calculi without obstructive change. These are relatively stable from the prior exam. Findings of diverticular disease without  evidence of diverticulitis. Electronically Signed   By: Inez Catalina M.D.   On: 07/03/2017 10:55   US Scrotum W/doppler  Result Date: 07/03/2017 CLINICAL DATA:  Left testicle pain. EXAM: SCROTAL ULTRASOUND DOPPLER ULTRASOUND OF THE TESTICLES TECHNIQUE: Complete ultrasound examination of the testicles, epididymis, and other scrotal structures was performed. Color and spectral Doppler ultrasound were also utilized to evaluate blood flow to the testicles. COMPARISON:  None. FINDINGS: Right testicle Measurements: 4.2 x 2.6 x 3.1 cm. No mass or microlithiasis visualized. Left testicle Measurements: 3.9 x 2.2 x 3.1 cm. No mass visualized. A few tiny  calcifications are noted. Right epididymis: Normal in size and appearance. 5 mm simple cyst in the head. Left epididymis: Normal in size and appearance. 5 mm simple cyst in the head. Hydrocele:  Small, complex hydrocele on the left. Varicocele:  Left-sided varicocele. Pulsed Doppler interrogation of both testes demonstrates normal low resistance arterial and venous waveforms bilaterally. Within the left inguinal canal, there is an irregular, predominantly hypoechoic area containing punctate internal echogenic foci, measuring 1.1 x 1.0 x 1.0 cm. There is no internal vascularity within the lesion. IMPRESSION: 1. Small, minimally complex left hydrocele. Normal sonographic appearance of the bilateral testicles. 2. Left-sided varicocele. 3. Irregular, predominantly hypoechoic 1.0 cm area containing punctate internal echogenic foci in the left inguinal canal likely reflects scarring from prior vasectomy. Electronically Signed   By: Titus Dubin M.D.   On: 07/03/2017 08:55    Procedures Procedures (including critical care time)  Medications Ordered in ED Medications  ondansetron (ZOFRAN) injection 4 mg (4 mg Intravenous Given 07/03/17 0748)  morphine 4 MG/ML injection 4 mg (4 mg Intravenous Given 07/03/17 0748)  fentaNYL (SUBLIMAZE) injection 25 mcg (25 mcg Intravenous  Given 07/03/17 0927)  iopamidol (ISOVUE-300) 61 % injection (100 mLs  Contrast Given 07/03/17 0951)  HYDROcodone-acetaminophen (NORCO/VICODIN) 5-325 MG per tablet 1 tablet (1 tablet Oral Given 07/03/17 1135)  ketorolac (TORADOL) 15 MG/ML injection 15 mg (15 mg Intravenous Given 07/03/17 1135)     Initial Impression / Assessment and Plan / ED Course  I have reviewed the triage vital signs and the nursing notes.  Pertinent labs & imaging results that were available during my care of the patient were reviewed by me and considered in my medical decision making (see chart for details).     Patient presenting for evaluation of abdominal pain.  Physical exam shows patient with tenderness palpation left quadrant without rigidity, guarding, or distention.  No tenderness of the left testicle.  Will obtain ultrasound to rule out torsion.  Basic abdominal labs ordered.  Zofran and morphine given for symptom control.  Ultrasound negative for torsion.  Pain was mildly improved with morphine, but returned with the ultrasound.  Fentanyl given for further pain control.  Although labs reassuring without leukocytosis, ultrasound showed area of possible scar tissue.  Will obtain CT abdomen pelvis for further evaluation.  Urine negative for blood, doubt stone.  CT reassuring without acute findings.  Discussed findings with patient.  Discussed that there is no sign of an emergent surgical process or infection.  Discussed symptomatic treatment and follow-up with GI.  Tolerated PO challenge without difficulty.  At this time, patient appears safe for discharge.  Return precautions given.  Patient states he understands and agrees to plan.  Final Clinical Impressions(s) / ED Diagnoses   Final diagnoses:  LLQ abdominal pain    ED Discharge Orders        Ordered    ondansetron (ZOFRAN) 4 MG tablet  Every 8 hours PRN,   Status:  Discontinued     07/03/17 1146    traMADol (ULTRAM) 50 MG tablet  Every 12 hours PRN,    Status:  Discontinued     07/03/17 1146    acetaminophen (TYLENOL) 500 MG tablet  3 times daily     07/03/17 1146    ondansetron (ZOFRAN) 4 MG tablet  Every 8 hours PRN     07/03/17 1148    traMADol (ULTRAM) 50 MG tablet  Every 12 hours PRN     07/03/17 1148  Franchot Heidelberg, PA-C 07/03/17 [place], MD 07/03/17 1624

## 2017-07-03 NOTE — ED Triage Notes (Signed)
Pt is c/o left flank pain that started about 1030 last night  Pt took tylenol around 0130 and slept til 0430 but then states the pain has been so bad he has not been able to go back to sleep  Describes pain as sharp

## 2018-12-09 DIAGNOSIS — N529 Male erectile dysfunction, unspecified: Secondary | ICD-10-CM | POA: Insufficient documentation

## 2020-03-13 ENCOUNTER — Encounter: Payer: Self-pay | Admitting: Emergency Medicine

## 2020-03-13 ENCOUNTER — Ambulatory Visit
Admission: EM | Admit: 2020-03-13 | Discharge: 2020-03-13 | Disposition: A | Discharge status: Home / Self Care | Payer: 59 | Attending: Family Medicine | Admitting: Family Medicine

## 2020-03-13 DIAGNOSIS — R519 Headache, unspecified: Secondary | ICD-10-CM

## 2020-03-13 DIAGNOSIS — J209 Acute bronchitis, unspecified: Secondary | ICD-10-CM

## 2020-03-13 DIAGNOSIS — J069 Acute upper respiratory infection, unspecified: Secondary | ICD-10-CM

## 2020-03-13 DIAGNOSIS — R0981 Nasal congestion: Secondary | ICD-10-CM

## 2020-03-13 DIAGNOSIS — R059 Cough, unspecified: Secondary | ICD-10-CM | POA: Diagnosis not present

## 2020-03-13 MED ORDER — AZITHROMYCIN 250 MG PO TABS: 250.0000 mg | ORAL_TABLET | Freq: Every day | ORAL | 0 refills | Status: DC

## 2020-03-13 MED ORDER — BENZONATATE 100 MG PO CAPS: 100.0000 mg | ORAL_CAPSULE | Freq: Three times a day (TID) | ORAL | 0 refills | Status: DC

## 2020-03-13 MED ORDER — PREDNISONE 10 MG (21) PO TBPK: ORAL_TABLET | Freq: Every day | ORAL | 0 refills | Status: AC

## 2020-03-13 NOTE — ED Provider Notes (Signed)
[place]   [id number] 03/13/20 Arrival Time: 75   CC: COVID symptoms  SUBJECTIVE: History from: patient.  ONIX JUMPER is a 44 y.o. male who presents with abrupt onset of nasal congestion, PND, and persistent dry cough for the last 4 days. Denies sick exposure to COVID, flu or strep. Denies recent travel. Has negative history of Covid. Has night completed Covid vaccines.  Has not had flu shot this year.  Has not taken OTC medications for this. There are no aggravating or alleviating factors. Denies previous symptoms in the past. Denies fever, chills, fatigue, sinus pain, rhinorrhea, SOB, wheezing, chest pain, nausea, changes in bowel or bladder habits.    ROS: As per HPI.  All other pertinent ROS negative.     Past Medical History:  Diagnosis Date  . Allergic rhinitis, seasonal   . Chronic back pain    degenerative   . GERD (gastroesophageal reflux disease)   . History of head injury    TREE FALL ON HEAD  . History of kidney stones   . History of mononucleosis    2009  . Hyperactive gag reflex   . Hyperlipidemia   . Hypertension   . Irregular heart rhythm   . PONV (postoperative nausea and vomiting)    after eye sx as child   Past Surgical History:  Procedure Laterality Date  . ADENOIDECTOMY  as child  . APPLICATION OF ROBOTIC ASSISTANCE FOR SPINAL PROCEDURE N/A 06/12/2016   Procedure: APPLICATION OF ROBOTIC ASSISTANCE FOR SPINAL PROCEDURE;  Surgeon: Kevan Ny Ditty, MD;  Location: [facility];  Service: Neurosurgery;  Laterality: N/A;  . EYE SURGERY     lasix  . LUMBAR LAMINECTOMY/DECOMPRESSION MICRODISCECTOMY  07/09/2011   Procedure: LUMBAR LAMINECTOMY/DECOMPRESSION MICRODISCECTOMY;  Surgeon: Melina Schools, MD;  Location: [facility];  Service: Orthopedics;  Laterality: N/A;  LEFT L5-S1 DISCECTOMY  . STRABISMUS SURGERY Left as child  . TONSILLECTOMY  09-09-2010  . VASECTOMY Bilateral 09/28/2015   Procedure: VASECTOMY;  Surgeon: Carolan Clines, MD;   Location: [facility];  Service: Urology;  Laterality: Bilateral;   Allergies  Allergen Reactions  . No Known Allergies    No current facility-administered medications on file prior to encounter.   Current Outpatient Medications on File Prior to Encounter  Medication Sig Dispense Refill  . aspirin EC 81 MG tablet Take 81 mg by mouth daily.    . cetirizine (ZYRTEC) 10 MG tablet Take 10 mg by mouth at bedtime.    . methocarbamol (ROBAXIN-750) 750 MG tablet Take 1 tablet (750 mg total) by mouth 3 (three) times daily as needed for muscle spasms. 90 tablet 1  . metoprolol succinate (TOPROL-XL) 50 MG 24 hr tablet Take 50 mg by mouth daily. Take with or immediately following a meal.    . omeprazole (PRILOSEC) 20 MG capsule TAKE 1 BY MOUTH DAILY (Patient taking differently: Take 20 mg by mouth every evening. TAKE 1 BY MOUTH DAILY) 90 capsule 3  . simvastatin (ZOCOR) 40 MG tablet Take 40 mg by mouth every evening.    Marland Kitchen acetaminophen (TYLENOL) 500 MG tablet Take 2 tablets (1,000 mg total) by mouth 3 (three) times daily. 30 tablet 0  . diclofenac (VOLTAREN) 50 MG EC tablet Take 50 mg by mouth daily.    . fluticasone (FLONASE) 50 MCG/ACT nasal spray Place 2 sprays into both nostrils daily. 48 g 3  . gabapentin (NEURONTIN) 300 MG capsule Take 1 capsule (300 mg total) by mouth 3 (three) times daily. (Patient  taking differently: Take 300 mg by mouth at bedtime. ) 120 capsule 2  . HYDROcodone-acetaminophen (NORCO/VICODIN) 5-325 MG tablet Take 1-2 tablets by mouth every 4 (four) hours as needed for moderate pain. (Patient not taking: Reported on 07/03/2017) 60 tablet 0  . meloxicam (MOBIC) 15 MG tablet Take 1 tablet (15 mg total) by mouth daily. (Patient not taking: Reported on 07/03/2017) 30 tablet 0  . Multiple Vitamin (MULTIVITAMIN) tablet Take 1 tablet by mouth daily.      . ondansetron (ZOFRAN) 4 MG tablet Take 1 tablet (4 mg total) by mouth every 8 (eight) hours as needed for nausea or  vomiting. 12 tablet 0  . Pseudoephedrine-Ibuprofen (ADVIL COLD/SINUS) 30-200 MG TABS Take 1 tablet by mouth 3 (three) times daily as needed (cold symptoms).    . traMADol (ULTRAM) 50 MG tablet Take 1 tablet (50 mg total) by mouth every 12 (twelve) hours as needed for severe pain. 10 tablet 0   Social History   Socioeconomic History  . Marital status: Single    Spouse name: Not on file  . Number of children: Not on file  . Years of education: Not on file  . Highest education level: Not on file  Occupational History  . Not on file  Tobacco Use  . Smoking status: Current Every Day Smoker    Years: 12.00    Types: Cigarettes    Last attempt to quit: 09/24/2006    Years since quitting: 13.4  . Smokeless tobacco: Never Used  . Tobacco comment: used vapors from 2008 until Sept 2016  Substance and Sexual Activity  . Alcohol use: Yes    Alcohol/week: 2.0 standard drinks    Types: 2 Glasses of wine per week    Comment: rare  . Drug use: No  . Sexual activity: Not on file  Other Topics Concern  . Not on file  Social History Narrative  . Not on file   Social Determinants of Health   Financial Resource Strain:   . Difficulty of Paying Living Expenses: Not on file  Food Insecurity:   . Worried About Charity fundraiser in the Last Year: Not on file  . Ran Out of Food in the Last Year: Not on file  Transportation Needs:   . Lack of Transportation (Medical): Not on file  . Lack of Transportation (Non-Medical): Not on file  Physical Activity:   . Days of Exercise per Week: Not on file  . Minutes of Exercise per Session: Not on file  Stress:   . Feeling of Stress : Not on file  Social Connections:   . Frequency of Communication with Friends and Family: Not on file  . Frequency of Social Gatherings with Friends and Family: Not on file  . Attends Religious Services: Not on file  . Active Member of Clubs or Organizations: Not on file  . Attends Archivist Meetings: Not on  file  . Marital Status: Not on file  Intimate Partner Violence:   . Fear of Current or Ex-Partner: Not on file  . Emotionally Abused: Not on file  . Physically Abused: Not on file  . Sexually Abused: Not on file   Family History  Problem Relation Age of Onset  . Hyperlipidemia Mother        diet controlled  . Heart disease Father        CAD/ MI x 2  . Diabetes Father   . Parkinsonism Father   . Anesthesia problems Neg Hx   .  Hypotension Neg Hx   . Malignant hyperthermia Neg Hx   . Pseudochol deficiency Neg Hx     OBJECTIVE:  Vitals:   03/13/20 1237  BP: 126/88  Pulse: 71  Resp: 15  Temp: 98.3 F (36.8 C)  TempSrc: Oral  SpO2: 97%     General appearance: alert; appears fatigued, but nontoxic; speaking in full sentences and tolerating own secretions HEENT: NCAT; Ears: EACs clear, TMs pearly gray; Eyes: PERRL.  EOM grossly intact. Sinuses: nontender; Nose: nares patent without rhinorrhea, Throat: oropharynx erythematous, cobblestoning present, tonsils non erythematous or enlarged, uvula midline  Neck: supple with LAD Lungs: unlabored respirations, symmetrical air entry; cough: absent; no respiratory distress; mild wheezing to bilateral lower lobes Heart: regular rate and rhythm.  Radial pulses 2+ symmetrical bilaterally Skin: warm and dry Psychological: alert and cooperative; normal mood and affect  LABS:  No results found for this or any previous visit (from the past 24 hour(s)).   ASSESSMENT & PLAN:  1. Acute upper respiratory infection   2. Acute bronchitis, unspecified organism   3. Cough   4. Nasal congestion   5. Nonintractable headache, unspecified chronicity pattern, unspecified headache type     Meds ordered this encounter  Medications  . azithromycin (ZITHROMAX) 250 MG tablet    Sig: Take 1 tablet (250 mg total) by mouth daily. Take first 2 tablets together, then 1 every day until finished.    Dispense:  6 tablet    Refill:  0    Order Specific  Question:   Supervising Provider    Answer:   Chase Picket [id number]  . benzonatate (TESSALON) 100 MG capsule    Sig: Take 1 capsule (100 mg total) by mouth every 8 (eight) hours.    Dispense:  21 capsule    Refill:  0    Order Specific Question:   Supervising Provider    Answer:   Chase Picket [id number]  . predniSONE (STERAPRED UNI-PAK 21 TAB) 10 MG (21) TBPK tablet    Sig: Take by mouth daily for 6 days. Take 6 tablets on day 1, 5 tablets on day 2, 4 tablets on day 3, 3 tablets on day 4, 2 tablets on day 5, 1 tablet on day 6    Dispense:  21 tablet    Refill:  0    Order Specific Question:   Supervising Provider    Answer:   Chase Picket [id number]   Prescribed azithromycin for URI Prescribed benzonatate  Prescribed steroid taper    COVID and flu testing ordered.  It will take between 1-2 days for test results.  Someone will contact you regarding abnormal results.    Patient should remain in quarantine until they have received Covid results.  If negative you may resume normal activities (go back to work/school) while practicing hand hygiene, social distance, and mask wearing.  If positive, patient should remain in quarantine for 10 days from symptom onset AND greater than 72 hours after symptoms resolution (absence of fever without the use of fever-reducing medication and improvement in respiratory symptoms), whichever is longer Get plenty of rest and push fluids Use OTC zyrtec for nasal congestion, runny nose, and/or sore throat Use OTC flonase for nasal congestion and runny nose Use medications daily for symptom relief Use OTC medications like ibuprofen or tylenol as needed fever or pain Call or go to the ED if you have any new or worsening symptoms such as fever, worsening cough, shortness of breath, chest tightness,  chest pain, turning blue, changes in mental status.  Reviewed expectations re: course of current medical issues. Questions answered. Outlined signs and  symptoms indicating need for more acute intervention. Patient verbalized understanding. After Visit Summary given.         Faustino Congress, NP 03/13/20 1611

## 2020-03-13 NOTE — Discharge Instructions (Signed)
I have sent in azithromycin for you to take. Take 2 tablets today, then one tablet daily for the next 4 days.  I have sent in a prednisone taper for you to take for 6 days. 6 tablets on day one, 5 tablets on day two, 4 tablets on day three, 3 tablets on day four, 2 tablets on day five, and 1 tablet on day six.  I have sent in [place] for you to use one capsule every 8 hours as needed for cough.  Follow up with this office or with primary care if symptoms are persisting.  Follow up in the ER for high fever, trouble swallowing, trouble breathing, other concerning symptoms.

## 2020-03-13 NOTE — ED Triage Notes (Addendum)
Patient c/o productive cough, "irritated throat", RT sided ear pain and nasal congestion since Friday night.   Patient denies fever, SOB, or headache.   Patient endorses "greenish" sputum production.   Patient OTC cough and cold medication w/ some relief of symptoms.

## 2020-03-14 LAB — COVID-19, FLU A+B AND RSV
Influenza A, NAA: NOT DETECTED
Influenza B, NAA: NOT DETECTED
RSV, NAA: NOT DETECTED
SARS-CoV-2, NAA: NOT DETECTED

## 2020-07-02 DIAGNOSIS — R739 Hyperglycemia, unspecified: Secondary | ICD-10-CM | POA: Insufficient documentation

## 2020-07-16 ENCOUNTER — Telehealth: Payer: Self-pay

## 2020-07-16 ENCOUNTER — Other Ambulatory Visit: Payer: Self-pay

## 2020-07-16 DIAGNOSIS — T7840XA Allergy, unspecified, initial encounter: Secondary | ICD-10-CM | POA: Insufficient documentation

## 2020-07-16 DIAGNOSIS — Z1211 Encounter for screening for malignant neoplasm of colon: Secondary | ICD-10-CM

## 2020-07-16 NOTE — Telephone Encounter (Signed)
Gastroenterology Pre-Procedure Review  Request Date: Thursday 08/02/20 Requesting Physician: Dr. Marius Ditch  PATIENT REVIEW QUESTIONS: The patient responded to the following health history questions as indicated:    1. Are you having any GI issues? no 2. Do you have a personal history of Polyps? no 3. Do you have a family history of Colon Cancer or Polyps? no 4. Diabetes Mellitus? no 5. Joint replacements in the past 12 months?no 6. Major health problems in the past 3 months?no 7. Any artificial heart valves, MVP, or defibrillator?no    MEDICATIONS & ALLERGIES:    Patient reports the following regarding taking any anticoagulation/antiplatelet therapy:   Plavix, Coumadin, Eliquis, Xarelto, Lovenox, Pradaxa, Brilinta, or Effient? no Aspirin? no  Patient confirms/reports the following medications:  Current Outpatient Medications  Medication Sig Dispense Refill  . acetaminophen (TYLENOL) 500 MG tablet Take 2 tablets (1,000 mg total) by mouth 3 (three) times daily. 30 tablet 0  . aspirin EC 81 MG tablet Take 81 mg by mouth daily.    Marland Kitchen azithromycin (ZITHROMAX) 250 MG tablet Take 1 tablet (250 mg total) by mouth daily. Take first 2 tablets together, then 1 every day until finished. 6 tablet 0  . benzonatate (TESSALON) 100 MG capsule Take 1 capsule (100 mg total) by mouth every 8 (eight) hours. 21 capsule 0  . cetirizine (ZYRTEC) 10 MG tablet Take 10 mg by mouth at bedtime.    . diclofenac (VOLTAREN) 50 MG EC tablet Take 50 mg by mouth daily.    . fluticasone (FLONASE) 50 MCG/ACT nasal spray Place 2 sprays into both nostrils daily. 48 g 3  . gabapentin (NEURONTIN) 300 MG capsule Take 1 capsule (300 mg total) by mouth 3 (three) times daily. (Patient taking differently: Take 300 mg by mouth at bedtime. ) 120 capsule 2  . HYDROcodone-acetaminophen (NORCO/VICODIN) 5-325 MG tablet Take 1-2 tablets by mouth every 4 (four) hours as needed for moderate pain. (Patient not taking: Reported on 07/03/2017) 60  tablet 0  . meloxicam (MOBIC) 15 MG tablet Take 1 tablet (15 mg total) by mouth daily. (Patient not taking: Reported on 07/03/2017) 30 tablet 0  . methocarbamol (ROBAXIN-750) 750 MG tablet Take 1 tablet (750 mg total) by mouth 3 (three) times daily as needed for muscle spasms. 90 tablet 1  . metoprolol succinate (TOPROL-XL) 50 MG 24 hr tablet Take 50 mg by mouth daily. Take with or immediately following a meal.    . Multiple Vitamin (MULTIVITAMIN) tablet Take 1 tablet by mouth daily.      Marland Kitchen omeprazole (PRILOSEC) 20 MG capsule TAKE 1 BY MOUTH DAILY (Patient taking differently: Take 20 mg by mouth every evening. TAKE 1 BY MOUTH DAILY) 90 capsule 3  . ondansetron (ZOFRAN) 4 MG tablet Take 1 tablet (4 mg total) by mouth every 8 (eight) hours as needed for nausea or vomiting. 12 tablet 0  . Pseudoephedrine-Ibuprofen (ADVIL COLD/SINUS) 30-200 MG TABS Take 1 tablet by mouth 3 (three) times daily as needed (cold symptoms).    . simvastatin (ZOCOR) 40 MG tablet Take 40 mg by mouth every evening.    . traMADol (ULTRAM) 50 MG tablet Take 1 tablet (50 mg total) by mouth every 12 (twelve) hours as needed for severe pain. 10 tablet 0   No current facility-administered medications for this visit.    Patient confirms/reports the following allergies:  Allergies  Allergen Reactions  . No Known Allergies     No orders of the defined types were placed in this encounter.  AUTHORIZATION INFORMATION Primary Insurance: 1D#: Group #:  Secondary Insurance: 1D#: Group #:  SCHEDULE INFORMATION: Date: 08/02/20 Time: Location: [facility]

## 2020-07-31 ENCOUNTER — Other Ambulatory Visit: Payer: 59

## 2020-08-01 ENCOUNTER — Telehealth: Payer: Self-pay | Admitting: Gastroenterology

## 2020-08-01 MED ORDER — NA SULFATE-K SULFATE-MG SULF 17.5-3.13-1.6 GM/177ML PO SOLN: 354.0000 mL | Freq: Once | ORAL | 0 refills | Status: AC

## 2020-08-01 NOTE — Telephone Encounter (Signed)
Sent prep to the pharmacy. Called and left a detail message informing patient it was at the pharmacy

## 2020-08-01 NOTE — Telephone Encounter (Signed)
Patient called and states his pharmacy does not have RX for  Prep. Patient is on schedule for procedure tomorrow. Please advise

## 2020-08-02 ENCOUNTER — Ambulatory Visit: Payer: 59 | Admitting: Anesthesiology

## 2020-08-02 ENCOUNTER — Encounter
Admission: RE | Disposition: A | Discharge status: Home / Self Care | Payer: Self-pay | Source: Ambulatory Visit | Attending: Gastroenterology

## 2020-08-02 ENCOUNTER — Ambulatory Visit
Admission: RE | Admit: 2020-08-02 | Discharge: 2020-08-02 | Disposition: A | Discharge status: Home / Self Care | Payer: 59 | Source: Ambulatory Visit | Attending: Gastroenterology | Admitting: Gastroenterology

## 2020-08-02 ENCOUNTER — Other Ambulatory Visit: Payer: Self-pay

## 2020-08-02 ENCOUNTER — Encounter: Payer: Self-pay | Admitting: Gastroenterology

## 2020-08-02 DIAGNOSIS — Z791 Long term (current) use of non-steroidal anti-inflammatories (NSAID): Secondary | ICD-10-CM | POA: Insufficient documentation

## 2020-08-02 DIAGNOSIS — E785 Hyperlipidemia, unspecified: Secondary | ICD-10-CM | POA: Diagnosis not present

## 2020-08-02 DIAGNOSIS — D122 Benign neoplasm of ascending colon: Secondary | ICD-10-CM | POA: Insufficient documentation

## 2020-08-02 DIAGNOSIS — Z1211 Encounter for screening for malignant neoplasm of colon: Secondary | ICD-10-CM | POA: Insufficient documentation

## 2020-08-02 DIAGNOSIS — Z8619 Personal history of other infectious and parasitic diseases: Secondary | ICD-10-CM | POA: Diagnosis not present

## 2020-08-02 DIAGNOSIS — K635 Polyp of colon: Secondary | ICD-10-CM | POA: Diagnosis not present

## 2020-08-02 DIAGNOSIS — Z87891 Personal history of nicotine dependence: Secondary | ICD-10-CM | POA: Diagnosis not present

## 2020-08-02 DIAGNOSIS — Z79899 Other long term (current) drug therapy: Secondary | ICD-10-CM | POA: Diagnosis not present

## 2020-08-02 DIAGNOSIS — Z8249 Family history of ischemic heart disease and other diseases of the circulatory system: Secondary | ICD-10-CM | POA: Diagnosis not present

## 2020-08-02 DIAGNOSIS — Z7982 Long term (current) use of aspirin: Secondary | ICD-10-CM | POA: Insufficient documentation

## 2020-08-02 DIAGNOSIS — I10 Essential (primary) hypertension: Secondary | ICD-10-CM | POA: Insufficient documentation

## 2020-08-02 DIAGNOSIS — Z9852 Vasectomy status: Secondary | ICD-10-CM | POA: Insufficient documentation

## 2020-08-02 DIAGNOSIS — K644 Residual hemorrhoidal skin tags: Secondary | ICD-10-CM | POA: Insufficient documentation

## 2020-08-02 HISTORY — PX: COLONOSCOPY WITH PROPOFOL: SHX5780

## 2020-08-02 SURGERY — COLONOSCOPY WITH PROPOFOL
Anesthesia: General

## 2020-08-02 MED ORDER — PROPOFOL 500 MG/50ML IV EMUL
INTRAVENOUS | Status: AC
  Filled 2020-08-02: qty 50

## 2020-08-02 MED ORDER — PROPOFOL 500 MG/50ML IV EMUL
INTRAVENOUS | Status: DC | PRN
  Administered 2020-08-02: 150 ug/kg/min via INTRAVENOUS

## 2020-08-02 MED ORDER — LIDOCAINE HCL (CARDIAC) PF 100 MG/5ML IV SOSY
PREFILLED_SYRINGE | INTRAVENOUS | Status: DC | PRN
  Administered 2020-08-02: 50 mg via INTRAVENOUS

## 2020-08-02 MED ORDER — PROPOFOL 10 MG/ML IV BOLUS
INTRAVENOUS | Status: DC | PRN
  Administered 2020-08-02: 70 mg via INTRAVENOUS
  Administered 2020-08-02: 20 mg via INTRAVENOUS

## 2020-08-02 MED ORDER — SODIUM CHLORIDE 0.9 % IV SOLN: INTRAVENOUS | Status: DC

## 2020-08-02 NOTE — H&P (Signed)
Cephas Darby, MD [address]  [place]  [place], [place] [address]  Main: [phone]  Fax: [phone] Pager: [phone]  Primary Care Physician:  Darrol Jump, PA-C Primary Gastroenterologist:  Dr. Cephas Darby  Pre-Procedure History & Physical: HPI:  Billy Blevins is a 45 y.o. male is here for an colonoscopy.   Past Medical History:  Diagnosis Date  . Allergic rhinitis, seasonal   . Chronic back pain    degenerative   . GERD (gastroesophageal reflux disease)   . History of head injury    TREE FALL ON HEAD  . History of kidney stones   . History of mononucleosis    2009  . Hyperactive gag reflex   . Hyperlipidemia   . Hypertension   . Irregular heart rhythm   . PONV (postoperative nausea and vomiting)    after eye sx as child    Past Surgical History:  Procedure Laterality Date  . ADENOIDECTOMY  as child  . APPLICATION OF ROBOTIC ASSISTANCE FOR SPINAL PROCEDURE N/A 06/12/2016   Procedure: APPLICATION OF ROBOTIC ASSISTANCE FOR SPINAL PROCEDURE;  Surgeon: Kevan Ny Ditty, MD;  Location: [facility];  Service: Neurosurgery;  Laterality: N/A;  . EYE SURGERY     lasix  . LUMBAR LAMINECTOMY/DECOMPRESSION MICRODISCECTOMY  07/09/2011   Procedure: LUMBAR LAMINECTOMY/DECOMPRESSION MICRODISCECTOMY;  Surgeon: Melina Schools, MD;  Location: [facility];  Service: Orthopedics;  Laterality: N/A;  LEFT L5-S1 DISCECTOMY  . STRABISMUS SURGERY Left as child  . TONSILLECTOMY  09-09-2010  . VASECTOMY Bilateral 09/28/2015   Procedure: VASECTOMY;  Surgeon: Carolan Clines, MD;  Location: [facility];  Service: Urology;  Laterality: Bilateral;    Prior to Admission medications   Medication Sig Start Date End Date Taking? Authorizing Provider  acetaminophen (TYLENOL) 500 MG tablet Take 2 tablets (1,000 mg total) by mouth 3 (three) times daily. 07/03/17  Yes Caccavale, Sophia, PA-C  aspirin EC 81 MG tablet Take 81 mg by mouth daily.   Yes [provider]   cetirizine (ZYRTEC) 10 MG tablet Take 10 mg by mouth at bedtime.   Yes [provider]  fluticasone (FLONASE) 50 MCG/ACT nasal spray Place 2 sprays into both nostrils daily. 05/24/13  Yes Tower, Wynelle Fanny, MD  meloxicam (MOBIC) 15 MG tablet Take 1 tablet (15 mg total) by mouth daily. 09/28/15  Yes Carolan Clines, MD  metoprolol succinate (TOPROL-XL) 50 MG 24 hr tablet Take 50 mg by mouth daily. Take with or immediately following a meal.   Yes [provider]  Multiple Vitamin (MULTIVITAMIN) tablet Take 1 tablet by mouth daily.   Yes [provider]  omeprazole (PRILOSEC) 20 MG capsule TAKE 1 BY MOUTH DAILY Patient taking differently: Take 20 mg by mouth every evening. TAKE 1 BY MOUTH DAILY 06/22/14  Yes Tower, Wynelle Fanny, MD  ondansetron (ZOFRAN) 4 MG tablet Take 1 tablet (4 mg total) by mouth every 8 (eight) hours as needed for nausea or vomiting. 07/03/17  Yes Caccavale, Sophia, PA-C  Pseudoephedrine-Ibuprofen 30-200 MG TABS Take 1 tablet by mouth 3 (three) times daily as needed (cold symptoms).   Yes [provider]  simvastatin (ZOCOR) 40 MG tablet Take 40 mg by mouth every evening.   Yes [provider]  azithromycin (ZITHROMAX) 250 MG tablet Take 1 tablet (250 mg total) by mouth daily. Take first 2 tablets together, then 1 every day until finished. Patient not taking: Reported on 08/02/2020 03/13/20   Faustino Congress, NP  benzonatate (TESSALON) 100  MG capsule Take 1 capsule (100 mg total) by mouth every 8 (eight) hours. Patient not taking: Reported on 08/02/2020 03/13/20   Faustino Congress, NP  diclofenac (VOLTAREN) 50 MG EC tablet Take 50 mg by mouth daily. Patient not taking: Reported on 08/02/2020    [provider]  gabapentin (NEURONTIN) 300 MG capsule Take 1 capsule (300 mg total) by mouth 3 (three) times daily. Patient taking differently: Take 300 mg by mouth at bedtime.  06/13/16 07/03/17  Costella, Vista Mink, PA-C   HYDROcodone-acetaminophen (NORCO/VICODIN) 5-325 MG tablet Take 1-2 tablets by mouth every 4 (four) hours as needed for moderate pain. Patient not taking: No sig reported 06/13/16   Costella, Vista Mink, PA-C  methocarbamol (ROBAXIN-750) 750 MG tablet Take 1 tablet (750 mg total) by mouth 3 (three) times daily as needed for muscle spasms. Patient not taking: Reported on 08/02/2020 06/13/16   Traci Sermon, PA-C  traMADol (ULTRAM) 50 MG tablet Take 1 tablet (50 mg total) by mouth every 12 (twelve) hours as needed for severe pain. 07/03/17   Caccavale, Sophia, PA-C    Allergies as of 07/16/2020 - Review Complete 07/16/2020  Allergen Reaction Noted  . No known allergies  06/11/2016    Family History  Problem Relation Age of Onset  . Hyperlipidemia Mother        diet controlled  . Heart disease Father        CAD/ MI x 2  . Diabetes Father   . Parkinsonism Father   . Anesthesia problems Neg Hx   . Hypotension Neg Hx   . Malignant hyperthermia Neg Hx   . Pseudochol deficiency Neg Hx     Social History   Socioeconomic History  . Marital status: Married    Spouse name: Not on file  . Number of children: Not on file  . Years of education: Not on file  . Highest education level: Not on file  Occupational History  . Not on file  Tobacco Use  . Smoking status: Former Smoker    Years: 12.00    Types: Cigarettes    Quit date: 09/24/2006    Years since quitting: 13.8  . Smokeless tobacco: Never Used  . Tobacco comment: used vapors from 2008 until Sept 2016  Vaping Use  . Vaping Use: Never used  Substance and Sexual Activity  . Alcohol use: Yes    Alcohol/week: 2.0 standard drinks    Types: 2 Glasses of wine per week    Comment: rare  . Drug use: No  . Sexual activity: Not on file  Other Topics Concern  . Not on file  Social History Narrative  . Not on file   Social Determinants of Health   Financial Resource Strain: Not on file  Food Insecurity: Not on file   Transportation Needs: Not on file  Physical Activity: Not on file  Stress: Not on file  Social Connections: Not on file  Intimate Partner Violence: Not on file    Review of Systems: See HPI, otherwise negative ROS  Physical Exam: BP (!) 116/97   Pulse 78   Temp (!) 96.4 F (35.8 C) (Temporal)   Resp 18   Ht 5\' 10"  (1.778 m)   Wt 93 kg   SpO2 98%   BMI 29.41 kg/m  General:   Alert,  pleasant and cooperative in NAD Head:  Normocephalic and atraumatic. Neck:  Supple; no masses or thyromegaly. Lungs:  Clear throughout to auscultation.    Heart:  Regular  rate and rhythm. Abdomen:  Soft, nontender and nondistended. Normal bowel sounds, without guarding, and without rebound.   Neurologic:  Alert and  oriented x4;  grossly normal neurologically.  Impression/Plan: Billy Blevins is here for an colonoscopy to be performed for colon cancer screening  Risks, benefits, limitations, and alternatives regarding  colonoscopy have been reviewed with the patient.  Questions have been answered.  All parties agreeable.   Sherri Sear, MD  08/02/2020, 8:21 AM

## 2020-08-02 NOTE — Transfer of Care (Signed)
Immediate Anesthesia Transfer of Care Note  Patient: Billy Blevins  Procedure(s) Performed: COLONOSCOPY WITH PROPOFOL (N/A )  Patient Location: PACU and Endoscopy Unit  Anesthesia Type:General  Level of Consciousness: drowsy and patient cooperative  Airway & Oxygen Therapy: Patient Spontanous Breathing  Post-op Assessment: Report given to RN and Post -op Vital signs reviewed and stable  Post vital signs: Reviewed and stable  Last Vitals:  Vitals Value Taken Time  BP 102/66   Temp    Pulse 64 08/02/20 0927  Resp 10 08/02/20 0927  SpO2 95 % 08/02/20 0927  Vitals shown include unvalidated device data.  Last Pain:  Vitals:   08/02/20 0748  TempSrc: Temporal  PainSc: 0-No pain         Complications: No complications documented.

## 2020-08-02 NOTE — Anesthesia Preprocedure Evaluation (Signed)
Anesthesia Evaluation  Patient identified by MRN, date of birth, ID band Patient awake    Reviewed: Allergy & Precautions, H&P , NPO status , Patient's Chart, lab work & pertinent test results, reviewed documented beta blocker date and time   History of Anesthesia Complications (+) PONV and history of anesthetic complications  Airway Mallampati: II   Neck ROM: full    Dental   Pulmonary former smoker,    breath sounds clear to auscultation       Cardiovascular hypertension, Pt. on medications and Pt. on home beta blockers  Rhythm:regular Rate:Normal     Neuro/Psych  Neuromuscular disease negative psych ROS   GI/Hepatic Neg liver ROS, GERD  ,  Endo/Other  negative endocrine ROS  Renal/GU negative Renal ROS     Musculoskeletal  (+) Arthritis ,   Abdominal   Peds negative pediatric ROS (+)  Hematology negative hematology ROS (+)   Anesthesia Other Findings Past Medical History: No date: Allergic rhinitis, seasonal No date: Chronic back pain     Comment:  degenerative  No date: GERD (gastroesophageal reflux disease) No date: History of head injury     Comment:  TREE FALL ON HEAD No date: History of kidney stones No date: History of mononucleosis     Comment:  2009 No date: Hyperactive gag reflex No date: Hyperlipidemia No date: Hypertension No date: Irregular heart rhythm No date: PONV (postoperative nausea and vomiting)     Comment:  after eye sx as child  Reproductive/Obstetrics                             Anesthesia Physical  Anesthesia Plan  ASA: II  Anesthesia Plan: General   Post-op Pain Management:    Induction: Intravenous  PONV Risk Score and Plan: Propofol infusion  Airway Management Planned: Nasal Cannula  Additional Equipment:   Intra-op Plan:   Post-operative Plan:   Informed Consent: I have reviewed the patients History and Physical, chart, labs and  discussed the procedure including the risks, benefits and alternatives for the proposed anesthesia with the patient or authorized representative who has indicated his/her understanding and acceptance.       Plan Discussed with: CRNA, Anesthesiologist and Surgeon  Anesthesia Plan Comments:         Anesthesia Quick Evaluation

## 2020-08-02 NOTE — Anesthesia Procedure Notes (Signed)
Procedure Name: MAC Date/Time: 08/02/2020 9:05 AM Performed by: Jerrye Noble, CRNA Pre-anesthesia Checklist: Patient identified, Emergency Drugs available, Suction available and Patient being monitored Patient Re-evaluated:Patient Re-evaluated prior to induction Oxygen Delivery Method: Nasal cannula

## 2020-08-02 NOTE — Anesthesia Postprocedure Evaluation (Signed)
Anesthesia Post Note  Patient: Billy Blevins  Procedure(s) Performed: COLONOSCOPY WITH PROPOFOL (N/A )  Patient location during evaluation: Endoscopy Anesthesia Type: General Level of consciousness: awake and alert and oriented Pain management: pain level controlled Vital Signs Assessment: post-procedure vital signs reviewed and stable Respiratory status: spontaneous breathing Cardiovascular status: blood pressure returned to baseline Anesthetic complications: no   No complications documented.   Last Vitals:  Vitals:   08/02/20 0748 08/02/20 0928  BP: (!) 116/97 102/66  Pulse: 78   Resp: 18   Temp: (!) 35.8 C (!) 35.7 C  SpO2: 98%     Last Pain:  Vitals:   08/02/20 0958  TempSrc:   PainSc: 0-No pain                 Tereasa Yilmaz

## 2020-08-02 NOTE — Op Note (Signed)
[facility]astroenterology Patient Name: Billy Blevins Procedure Date: 08/02/2020 8:54 AM MRN: [id number] Account #: [id number] Date of Birth: 06-12-1975 Admit Type: Outpatient Age: 45 Room: [facility] ENDO ROOM 4 Gender: Male Note Status: Finalized Procedure:             Colonoscopy Indications:           Screening for colorectal malignant neoplasm, This is                         the patient's first colonoscopy Providers:             Lin Landsman MD, MD Referring MD:          Darrol Jump (Referring MD) Medicines:             Monitored Anesthesia Care, General Anesthesia Complications:         No immediate complications. Estimated blood loss: None. Procedure:             Pre-Anesthesia Assessment:                        - Prior to the procedure, a History and Physical was                         performed, and patient medications and allergies were                         reviewed. The patient is competent. The risks and                         benefits of the procedure and the sedation options and                         risks were discussed with the patient. All questions                         were answered and informed consent was obtained.                         Patient identification and proposed procedure were                         verified by the physician, the nurse, the                         anesthesiologist, the anesthetist and the technician                         in the pre-procedure area in the procedure room in the                         endoscopy suite. Mental Status Examination: alert and                         oriented. Airway Examination: normal oropharyngeal                         airway and neck mobility. Respiratory Examination:  clear to auscultation. CV Examination: normal.                         Prophylactic Antibiotics: The patient does not require                         prophylactic antibiotics.  Prior Anticoagulants: The                         patient has taken no previous anticoagulant or                         antiplatelet agents. ASA Grade Assessment: II - A                         patient with mild systemic disease. After reviewing                         the risks and benefits, the patient was deemed in                         satisfactory condition to undergo the procedure. The                         anesthesia plan was to use general anesthesia.                         Immediately prior to administration of medications,                         the patient was re-assessed for adequacy to receive                         sedatives. The heart rate, respiratory rate, oxygen                         saturations, blood pressure, adequacy of pulmonary                         ventilation, and response to care were monitored                         throughout the procedure. The physical status of the                         patient was re-assessed after the procedure.                        After obtaining informed consent, the colonoscope was                         passed under direct vision. Throughout the procedure,                         the patient's blood pressure, pulse, and oxygen                         saturations were monitored continuously. The  Colonoscope was introduced through the anus and                         advanced to the the terminal ileum, with                         identification of the appendiceal orifice and IC                         valve. The colonoscopy was performed without                         difficulty. The patient tolerated the procedure well.                         The quality of the bowel preparation was evaluated                         using the BBPS [facility] Bowel Preparation Scale) with                         scores of: Right Colon = 3, Transverse Colon = 3 and                         Left Colon = 3 (entire  mucosa seen well with no                         residual staining, small fragments of stool or opaque                         liquid). The total BBPS score equals 9. Findings:      The perianal and digital rectal examinations were normal. Pertinent       negatives include normal sphincter tone and no palpable rectal lesions.      The terminal ileum appeared normal.      A 9 mm polyp was found in the proximal ascending colon. The polyp was       sessile. The polyp was removed with a cold snare. Resection and       retrieval were complete.      Non-bleeding external hemorrhoids were found during retroflexion. The       hemorrhoids were medium-sized.      The exam was otherwise without abnormality. Impression:            - The examined portion of the ileum was normal.                        - One 9 mm polyp in the proximal ascending colon,                         removed with a cold snare. Resected and retrieved.                        - Non-bleeding external hemorrhoids.                        - The examination was otherwise normal. Recommendation:        - Discharge patient to  home (with escort).                        - Resume previous diet today.                        - Continue present medications.                        - Await pathology results.                        - Repeat colonoscopy in 5 years for surveillance. Procedure Code(s):     --- Professional ---                        [phone], Colonoscopy, flexible; with removal of                         tumor(s), polyp(s), or other lesion(s) by snare                         technique Diagnosis Code(s):     --- Professional ---                        Z12.11, Encounter for screening for malignant neoplasm                         of colon                        K64.4, Residual hemorrhoidal skin tags                        K63.5, Polyp of colon CPT copyright 2019 American Medical Association. All rights reserved. The codes documented  in this report are preliminary and upon coder review may  be revised to meet current compliance requirements. Dr. Ulyess Mort Lin Landsman MD, MD 08/02/2020 9:27:46 AM This report has been signed electronically. Number of Addenda: 0 Note Initiated On: 08/02/2020 8:54 AM Scope Withdrawal Time: 0 hours 10 minutes 14 seconds  Total Procedure Duration: 0 hours 14 minutes 50 seconds  Estimated Blood Loss:  Estimated blood loss: none.      [facility]

## 2020-08-03 ENCOUNTER — Encounter: Payer: Self-pay | Admitting: Gastroenterology

## 2020-08-03 LAB — SURGICAL PATHOLOGY

## 2020-08-06 ENCOUNTER — Encounter: Payer: Self-pay | Admitting: Gastroenterology

## 2020-10-15 ENCOUNTER — Encounter: Payer: Self-pay | Admitting: Emergency Medicine

## 2020-10-15 ENCOUNTER — Emergency Department
Admission: EM | Admit: 2020-10-15 | Discharge: 2020-10-15 | Disposition: A | Discharge status: Home / Self Care | Payer: 59 | Attending: Emergency Medicine | Admitting: Emergency Medicine

## 2020-10-15 ENCOUNTER — Other Ambulatory Visit: Payer: Self-pay

## 2020-10-15 ENCOUNTER — Emergency Department: Payer: 59

## 2020-10-15 DIAGNOSIS — Z79899 Other long term (current) drug therapy: Secondary | ICD-10-CM | POA: Insufficient documentation

## 2020-10-15 DIAGNOSIS — N23 Unspecified renal colic: Secondary | ICD-10-CM | POA: Insufficient documentation

## 2020-10-15 DIAGNOSIS — Z87891 Personal history of nicotine dependence: Secondary | ICD-10-CM | POA: Insufficient documentation

## 2020-10-15 DIAGNOSIS — I2510 Atherosclerotic heart disease of native coronary artery without angina pectoris: Secondary | ICD-10-CM | POA: Diagnosis not present

## 2020-10-15 DIAGNOSIS — Z7982 Long term (current) use of aspirin: Secondary | ICD-10-CM | POA: Insufficient documentation

## 2020-10-15 DIAGNOSIS — I10 Essential (primary) hypertension: Secondary | ICD-10-CM | POA: Insufficient documentation

## 2020-10-15 DIAGNOSIS — R109 Unspecified abdominal pain: Secondary | ICD-10-CM | POA: Diagnosis present

## 2020-10-15 LAB — URINALYSIS, COMPLETE (UACMP) WITH MICROSCOPIC
Bacteria, UA: NONE SEEN
Bilirubin Urine: NEGATIVE
Glucose, UA: NEGATIVE mg/dL
Ketones, ur: NEGATIVE mg/dL
Leukocytes,Ua: NEGATIVE
Nitrite: NEGATIVE
Protein, ur: NEGATIVE mg/dL
RBC / HPF: >50 RBC/hpf — ABNORMAL HIGH (ref 0–5)
Specific Gravity, Urine: 1.028 (ref 1.005–1.030)
Squamous Epithelial / HPF: NONE SEEN (ref 0–5)
pH: 5 (ref 5.0–8.0)

## 2020-10-15 LAB — CBC WITH DIFFERENTIAL/PLATELET
Abs Immature Granulocytes: 0.02 10*3/uL (ref 0.00–0.07)
Basophils Absolute: 0 10*3/uL (ref 0.0–0.1)
Basophils Relative: 1 %
Eosinophils Absolute: 0.1 10*3/uL (ref 0.0–0.5)
Eosinophils Relative: 2 %
HCT: 42.8 % (ref 39.0–52.0)
Hemoglobin: 15.4 g/dL (ref 13.0–17.0)
Immature Granulocytes: 0 %
Lymphocytes Relative: 35 %
Lymphs Abs: 2.7 10*3/uL (ref 0.7–4.0)
MCH: 30.7 pg (ref 26.0–34.0)
MCHC: 36 g/dL (ref 30.0–36.0)
MCV: 85.4 fL (ref 80.0–100.0)
Monocytes Absolute: 0.7 10*3/uL (ref 0.1–1.0)
Monocytes Relative: 9 %
Neutro Abs: 4.2 10*3/uL (ref 1.7–7.7)
Neutrophils Relative %: 53 %
Platelets: 184 10*3/uL (ref 150–400)
RBC: 5.01 MIL/uL (ref 4.22–5.81)
RDW: 12.3 % (ref 11.5–15.5)
WBC: 7.7 10*3/uL (ref 4.0–10.5)
nRBC: 0 % (ref 0.0–0.2)

## 2020-10-15 LAB — BASIC METABOLIC PANEL
Anion gap: 4 — ABNORMAL LOW (ref 5–15)
BUN: 14 mg/dL (ref 6–20)
CO2: 29 mmol/L (ref 22–32)
Calcium: 8.9 mg/dL (ref 8.9–10.3)
Chloride: 106 mmol/L (ref 98–111)
Creatinine, Ser: 1.13 mg/dL (ref 0.61–1.24)
GFR, Estimated: >60 mL/min (ref >60)
Glucose, Bld: 156 mg/dL — ABNORMAL HIGH (ref 70–99)
Potassium: 3.6 mmol/L (ref 3.5–5.1)
Sodium: 139 mmol/L (ref 135–145)

## 2020-10-15 MED ORDER — OXYCODONE-ACETAMINOPHEN 5-325 MG PO TABS
2.0000 | ORAL_TABLET | Freq: Once | ORAL | Status: AC
  Administered 2020-10-15: 2 via ORAL
  Filled 2020-10-15: qty 2

## 2020-10-15 MED ORDER — TAMSULOSIN HCL 0.4 MG PO CAPS: 0.4000 mg | ORAL_CAPSULE | Freq: Every day | ORAL | 0 refills | Status: AC

## 2020-10-15 MED ORDER — ONDANSETRON 4 MG PO TBDP: 4.0000 mg | ORAL_TABLET | Freq: Three times a day (TID) | ORAL | 0 refills | Status: AC | PRN

## 2020-10-15 MED ORDER — OXYCODONE-ACETAMINOPHEN 5-325 MG PO TABS: 1.0000 | ORAL_TABLET | ORAL | 0 refills | Status: AC | PRN

## 2020-10-15 MED ORDER — TAMSULOSIN HCL 0.4 MG PO CAPS
0.4000 mg | ORAL_CAPSULE | Freq: Once | ORAL | Status: AC
  Administered 2020-10-15: 0.4 mg via ORAL
  Filled 2020-10-15: qty 1

## 2020-10-15 NOTE — Discharge Instructions (Addendum)
1. Take pain & nausea medicines as needed (Percocet/Zofran #30). Make sure to take a stool softener while taking narcotic pain medicines. 2. Take Flomax 0.4mg daily x 14 days. 3. Drink plenty of bottled or filtered water daily. 4. Return to the ER for worsening symptoms, persistent vomiting, fever, difficulty breathing or other concerns.  

## 2020-10-15 NOTE — ED Provider Notes (Signed)
[facility] Emergency Department Provider Note   ____________________________________________   Event Date/Time   First MD Initiated Contact with Patient 10/15/20 [phone]     (approximate)  I have reviewed the triage vital signs and the nursing notes.   HISTORY  Chief Complaint Flank Pain    HPI Billy Blevins is a 45 y.o. male presents to the ED from home with a chief complaint of left flank pain.  Patient awoke approximately 1:30 AM with left-sided flank pain associated with nausea.  History of kidney stones and states this feels similarly.  No prior history of lithotripsy or stenting.  Denies fever, cough, chest pain, shortness of breath, abdominal pain, vomiting, hematuria, testicular pain or swelling.     Past Medical History:  Diagnosis Date   Allergic rhinitis, seasonal    Chronic back pain    degenerative    GERD (gastroesophageal reflux disease)    History of head injury    TREE FALL ON HEAD   History of kidney stones    History of mononucleosis    2009   Hyperactive gag reflex    Hyperlipidemia    Hypertension    Irregular heart rhythm    PONV (postoperative nausea and vomiting)    after eye sx as child    Patient Active Problem List   Diagnosis Date Noted   Encounter for screening colonoscopy    Allergy 07/16/2020   Hyperglycemia 07/02/2020   Erectile dysfunction 12/09/2018   Lumbosacral spondylosis with radiculopathy 06/12/2016   Lumbar radiculopathy 06/06/2016   Coronary artery disease involving native coronary artery of native heart without angina pectoris 01/29/2016   Chronic back pain 01/10/2016   Essential hypertension 12/13/2015   Pure hypercholesterolemia 12/13/2015   Routine general medical examination at a health care facility 05/16/2013   Lumbar degenerative disc disease 05/28/2011   Tachycardia 05/28/2011   CARPAL TUNNEL SYNDROME 08/17/2008   RHINITIS 08/17/2008   HYPERLIPIDEMIA 02/14/2008   G E R D 01/01/2007     Past Surgical History:  Procedure Laterality Date   ADENOIDECTOMY  as child   APPLICATION OF ROBOTIC ASSISTANCE FOR SPINAL PROCEDURE N/A 06/12/2016   Procedure: APPLICATION OF ROBOTIC ASSISTANCE FOR SPINAL PROCEDURE;  Surgeon: Kevan Ny Ditty, MD;  Location: [place];  Service: Neurosurgery;  Laterality: N/A;   COLONOSCOPY WITH PROPOFOL N/A 08/02/2020   Procedure: COLONOSCOPY WITH PROPOFOL;  Surgeon: Lin Landsman, MD;  Location: [facility] ENDOSCOPY;  Service: Gastroenterology;  Laterality: N/A;   EYE SURGERY     lasix   LUMBAR LAMINECTOMY/DECOMPRESSION MICRODISCECTOMY  07/09/2011   Procedure: LUMBAR LAMINECTOMY/DECOMPRESSION MICRODISCECTOMY;  Surgeon: Melina Schools, MD;  Location: [place];  Service: Orthopedics;  Laterality: N/A;  LEFT L5-S1 DISCECTOMY   STRABISMUS SURGERY Left as child   TONSILLECTOMY  09-09-2010   VASECTOMY Bilateral 09/28/2015   Procedure: VASECTOMY;  Surgeon: Carolan Clines, MD;  Location: [facility];  Service: Urology;  Laterality: Bilateral;    Prior to Admission medications   Medication Sig Start Date End Date Taking? Authorizing Provider  ondansetron (ZOFRAN ODT) 4 MG disintegrating tablet Take 1 tablet (4 mg total) by mouth every 8 (eight) hours as needed for nausea or vomiting. 10/15/20  Yes Paulette Blanch, MD  oxyCODONE-acetaminophen (PERCOCET/ROXICET) 5-325 MG tablet Take 1 tablet by mouth every 4 (four) hours as needed for severe pain. 10/15/20  Yes Paulette Blanch, MD  tamsulosin (FLOMAX) 0.4 MG CAPS capsule Take 1 capsule (0.4 mg total) by mouth daily. 10/15/20  Yes Paulette Blanch, MD  acetaminophen (TYLENOL) 500 MG tablet Take 2 tablets (1,000 mg total) by mouth 3 (three) times daily. 07/03/17   Caccavale, Sophia, PA-C  aspirin EC 81 MG tablet Take 81 mg by mouth daily.    [provider]  cetirizine (ZYRTEC) 10 MG tablet Take 10 mg by mouth at bedtime.    [provider]  fluticasone (FLONASE) 50 MCG/ACT nasal spray Place 2  sprays into both nostrils daily. 05/24/13   Tower, Wynelle Fanny, MD  gabapentin (NEURONTIN) 300 MG capsule Take 1 capsule (300 mg total) by mouth 3 (three) times daily. Patient taking differently: Take 300 mg by mouth at bedtime.  06/13/16 07/03/17  Costella, Vista Mink, PA-C  meloxicam (MOBIC) 15 MG tablet Take 1 tablet (15 mg total) by mouth daily. 09/28/15   Carolan Clines, MD  metoprolol succinate (TOPROL-XL) 50 MG 24 hr tablet Take 50 mg by mouth daily. Take with or immediately following a meal.    [provider]  Multiple Vitamin (MULTIVITAMIN) tablet Take 1 tablet by mouth daily.    [provider]  omeprazole (PRILOSEC) 20 MG capsule TAKE 1 BY MOUTH DAILY Patient taking differently: Take 20 mg by mouth every evening. TAKE 1 BY MOUTH DAILY 06/22/14   Tower, Wynelle Fanny, MD  ondansetron (ZOFRAN) 4 MG tablet Take 1 tablet (4 mg total) by mouth every 8 (eight) hours as needed for nausea or vomiting. 07/03/17   Caccavale, Sophia, PA-C  Pseudoephedrine-Ibuprofen 30-200 MG TABS Take 1 tablet by mouth 3 (three) times daily as needed (cold symptoms).    [provider]  simvastatin (ZOCOR) 40 MG tablet Take 40 mg by mouth every evening.    [provider]  traMADol (ULTRAM) 50 MG tablet Take 1 tablet (50 mg total) by mouth every 12 (twelve) hours as needed for severe pain. 07/03/17   Caccavale, Sophia, PA-C    Allergies No known allergies  Family History  Problem Relation Age of Onset   Hyperlipidemia Mother        diet controlled   Heart disease Father        CAD/ MI x 2   Diabetes Father    Parkinsonism Father    Anesthesia problems Neg Hx    Hypotension Neg Hx    Malignant hyperthermia Neg Hx    Pseudochol deficiency Neg Hx     Social History Social History   Tobacco Use   Smoking status: Former    Years: 12.00    Pack years: 0.00    Types: Cigarettes    Quit date: 09/24/2006    Years since quitting: 14.0   Smokeless tobacco: Never   Tobacco comments:     used vapors from 2008 until Sept 2016  Vaping Use   Vaping Use: Never used  Substance Use Topics   Alcohol use: Yes    Alcohol/week: 2.0 standard drinks    Types: 2 Glasses of wine per week    Comment: rare   Drug use: No    Review of Systems  Constitutional: No fever/chills Eyes: No visual changes. ENT: No sore throat. Cardiovascular: Denies chest pain. Respiratory: Denies shortness of breath. Gastrointestinal: No abdominal pain.  Positive for left flank pain and nausea, no vomiting.  No diarrhea.  No constipation. Genitourinary: Negative for dysuria. Musculoskeletal: Negative for back pain. Skin: Negative for rash. Neurological: Negative for headaches, focal weakness or numbness.   ____________________________________________   PHYSICAL EXAM:  VITAL SIGNS: ED Triage Vitals  Enc  Vitals Group     BP 10/15/20 0402 125/83     Pulse Rate 10/15/20 0402 60     Resp 10/15/20 0402 20     Temp 10/15/20 0402 97.6 F (36.4 C)     Temp Source 10/15/20 0402 Oral     SpO2 10/15/20 0402 96 %     Weight --      Height --      Head Circumference --      Peak Flow --      Pain Score 10/15/20 0357 10     Pain Loc --      Pain Edu? --      Excl. in [place]? --     Constitutional: Alert and oriented. Well appearing and in no acute distress. Eyes: Conjunctivae are normal. PERRL. EOMI. Head: Atraumatic. Nose: No congestion/rhinnorhea. Mouth/Throat: Mucous membranes are moist.   Neck: No stridor.   Cardiovascular: Normal rate, regular rhythm. Grossly normal heart sounds.  Good peripheral circulation. Respiratory: Normal respiratory effort.  No retractions. Lungs CTAB. Gastrointestinal: Soft and nontender to light or deep palpation. No distention. No abdominal bruits.  Mild left CVA tenderness. Musculoskeletal: No lower extremity tenderness nor edema.  No joint effusions. Neurologic:  Normal speech and language. No gross focal neurologic deficits are appreciated. No gait  instability. Skin:  Skin is warm, dry and intact. No rash noted.  No vesicles. Psychiatric: Mood and affect are normal. Speech and behavior are normal.  ____________________________________________   LABS (all labs ordered are listed, but only abnormal results are displayed)  Labs Reviewed  BASIC METABOLIC PANEL - Abnormal; Notable for the following components:      Result Value   Glucose, Bld 156 (*)    Anion gap 4 (*)    All other components within normal limits  URINALYSIS, COMPLETE (UACMP) WITH MICROSCOPIC - Abnormal; Notable for the following components:   Color, Urine YELLOW (*)    APPearance HAZY (*)    Hgb urine dipstick SMALL (*)    RBC / HPF >50 (*)    All other components within normal limits  CBC WITH DIFFERENTIAL/PLATELET   ____________________________________________  EKG  None ____________________________________________  RADIOLOGY I, Sherrol Vicars J, personally viewed and evaluated these images (plain radiographs) as part of my medical decision making, as well as reviewing the written report by the radiologist.  ED MD interpretation: 5 mm UVJ stone with hydroureteronephrosis  Official radiology report(s): CT Renal Stone Study  Result Date: 10/15/2020 CLINICAL DATA:  Flank pain with kidney stone suspected EXAM: CT ABDOMEN AND PELVIS WITHOUT CONTRAST TECHNIQUE: Multidetector CT imaging of the abdomen and pelvis was performed following the standard protocol without IV contrast. COMPARISON:  07/03/2017 FINDINGS: Lower chest:  No acute finding. Hepatobiliary: No focal liver abnormality.No evidence of biliary obstruction or stone. Pancreas: Unremarkable. Spleen: Unremarkable. Adrenals/Urinary Tract: Negative adrenals. Mild left hydroureteronephrosis due to a 5 mm stone at the UVJ. At least 2 punctate left renal calculi. At least 4 right renal calculi measured up to 5 mm in the upper pole. Right renal cystic density with mild enlargement from 2019. Negative collapsed  urinary bladder. Stomach/Bowel:  No obstruction. No appendicitis. Vascular/Lymphatic: No acute vascular abnormality. No mass or adenopathy. Reproductive:No pathologic findings. Other: No ascites or pneumoperitoneum. Musculoskeletal: No acute abnormalities. L5-S1 fusion with solid arthrodesis. IMPRESSION: 1. Left hydroureteronephrosis from a 5 mm UVJ calculus. 2. Bilateral nephrolithiasis. Electronically Signed   By: Monte Fantasia M.D.   On: 10/15/2020 04:49    ____________________________________________  PROCEDURES  Procedure(s) performed (including Critical Care):  Procedures   ____________________________________________   INITIAL IMPRESSION / ASSESSMENT AND PLAN / ED COURSE  As part of my medical decision making, I reviewed the following data within the [place] History obtained from family, Nursing notes reviewed and incorporated, Labs reviewed, Old chart reviewed, Radiograph reviewed, Notes from prior ED visits, and [place] Controlled Substance Database     45 year old male presenting with left flank pain. Differential diagnosis includes, but is not limited to, acute appendicitis, renal colic, testicular torsion, urinary tract infection/pyelonephritis, prostatitis,  epididymitis, diverticulitis, small bowel obstruction or ileus, colitis, abdominal aortic aneurysm, gastroenteritis, hernia, etc.   Laboratory and UA results unremarkable.  CT imaging demonstrates 5 mm left UVJ stone.  Patient received Percocet while awaiting treatment room and is currently pain-free.  Will discharge home with prescriptions for Percocet, Zofran and a 2-week course of Flomax.  Patient will follow up with urology.  Strict return precautions given.  Patient and spouse verbalized understanding agree with plan of care.      ____________________________________________   FINAL CLINICAL IMPRESSION(S) / ED DIAGNOSES  Final diagnoses:  Ureteral colic  Left flank pain     ED Discharge  Orders          Ordered    oxyCODONE-acetaminophen (PERCOCET/ROXICET) 5-325 MG tablet  Every 4 hours PRN        10/15/20 0548    ondansetron (ZOFRAN ODT) 4 MG disintegrating tablet  Every 8 hours PRN        10/15/20 0548    tamsulosin (FLOMAX) 0.4 MG CAPS capsule  Daily        10/15/20 0548             Note:  This document was prepared using Dragon voice recognition software and may include unintentional dictation errors.    Paulette Blanch, MD 10/15/20 [phone]

## 2020-10-15 NOTE — ED Triage Notes (Signed)
Pt c/o L flank pain x 2 hrs. Pt states hx of kidney stones, this feels the same.

## 2020-10-25 ENCOUNTER — Other Ambulatory Visit: Payer: Self-pay

## 2020-10-25 ENCOUNTER — Encounter: Payer: Self-pay | Admitting: Urology

## 2020-10-25 ENCOUNTER — Ambulatory Visit (INDEPENDENT_AMBULATORY_CARE_PROVIDER_SITE_OTHER): Payer: 59 | Admitting: Urology

## 2020-10-25 VITALS — BP 130/84 | HR 73 | Ht 70.0 in | Wt 206.0 lb

## 2020-10-25 DIAGNOSIS — N200 Calculus of kidney: Secondary | ICD-10-CM

## 2020-10-25 NOTE — Patient Instructions (Signed)
Textbook of Natural Medicine (5th ed., pp. 1518-1527.e3). [place], [place]: Elsevier.">  Dietary Guidelines to Help Prevent Kidney Stones Kidney stones are deposits of minerals and salts that form inside your kidneys. Your risk of developing kidney stones may be greater depending on your diet, your lifestyle, the medicines you take, and whether you have certain medical conditions. Most people can lower their chances of developing kidney stones by following the instructions below. Your dietitian may give you more specific instructions depending on your overall health and the type of kidney stones youtend to develop. What are tips for following this plan? Reading food labels  Choose foods with "no salt added" or "low-salt" labels. Limit your salt (sodium) intake to less than 1,500 mg a day. Choose foods with calcium for each meal and snack. Try to eat about 300 mg of calcium at each meal. Foods that contain 200-500 mg of calcium a serving include: 8 oz (237 mL) of milk, calcium-fortifiednon-dairy milk, and calcium-fortifiedfruit juice. Calcium-fortified means that calcium has been added to these drinks. 8 oz (237 mL) of kefir, yogurt, and soy yogurt. 4 oz (114 g) of tofu. 1 oz (28 g) of cheese. 1 cup (150 g) of dried figs. 1 cup (91 g) of cooked broccoli. One 3 oz (85 g) can of sardines or mackerel. Most people need 1,000-1,500 mg of calcium a day. Talk to your dietitian abouthow much calcium is recommended for you. Shopping Buy plenty of fresh fruits and vegetables. Most people do not need to avoid fruits and vegetables, even if these foods contain nutrients that may contribute to kidney stones. When shopping for convenience foods, choose: Whole pieces of fruit. Pre-made salads with dressing on the side. Low-fat fruit and yogurt smoothies. Avoid buying frozen meals or prepared deli foods. These can be high in sodium. Look for foods with live cultures, such as yogurt and kefir. Choose high-fiber  grains, such as whole-wheat breads, oat bran, and wheat cereals. Cooking Do not add salt to food when cooking. Place a salt shaker on the table and allow each person to add his or her own salt to taste. Use vegetable protein, such as beans, textured vegetable protein (TVP), or tofu, instead of meat in pasta, casseroles, and soups. Meal planning Eat less salt, if told by your dietitian. To do this: Avoid eating processed or pre-made food. Avoid eating fast food. Eat less animal protein, including cheese, meat, poultry, or fish, if told by your dietitian. To do this: Limit the number of times you have meat, poultry, fish, or cheese each week. Eat a diet free of meat at least 2 days a week. Eat only one serving each day of meat, poultry, fish, or seafood. When you prepare animal protein, cut pieces into small portion sizes. For most meat and fish, one serving is about the size of the palm of your hand. Eat at least five servings of fresh fruits and vegetables each day. To do this: Keep fruits and vegetables on hand for snacks. Eat one piece of fruit or a handful of berries with breakfast. Have a salad and fruit at lunch. Have two kinds of vegetables at dinner. Limit foods that are high in a substance called oxalate. These include: Spinach (cooked), rhubarb, beets, sweet potatoes, and Swiss chard. Peanuts. Potato chips, french fries, and baked potatoes with skin on. Nuts and nut products. Chocolate. If you regularly take a diuretic medicine, make sure to eat at least 1 or 2 servings of fruits or vegetables that are   high in potassium each day. These include: Avocado. Banana. Orange, prune, carrot, or tomato juice. Baked potato. Cabbage. Beans and split peas. Lifestyle  Drink enough fluid to keep your urine pale yellow. This is the most important thing you can do. Spread your fluid intake throughout the day. If you drink alcohol: Limit how much you use to: 0-1 drink a day for women who  are not pregnant. 0-2 drinks a day for men. Be aware of how much alcohol is in your drink. In the [place] one drink equals one 12 oz bottle of beer (355 mL), one 5 oz glass of wine (148 mL), or one 1 oz glass of hard liquor (44 mL). Lose weight if told by your health care provider. Work with your dietitian to find an eating plan and weight loss strategies that work best for you.  General information Talk to your health care provider and dietitian about taking daily supplements. You may be told the following depending on your health and the cause of your kidney stones: Not to take supplements with vitamin C. To take a calcium supplement. To take a daily probiotic supplement. To take other supplements such as magnesium, fish oil, or vitamin B6. Take over-the-counter and prescription medicines only as told by your health care provider. These include supplements. What foods should I limit? Limit your intake of the following foods, or eat them as told by your dietitian. Vegetables Spinach. Rhubarb. Beets. Canned vegetables. Pickles. Olives. Baked potatoeswith skin. Grains Wheat bran. Baked goods. Salted crackers. Cereals high in sugar. Meats and other proteins Nuts. Nut butters. Large portions of meat, poultry, or fish. Salted, precooked,or cured meats, such as sausages, meat loaves, and hot dogs. Dairy Cheese. Beverages Regular soft drinks. Regular vegetable juice. Seasonings and condiments Seasoning blends with salt. Salad dressings. Soy sauce. Ketchup. Barbecue sauce. Other foods Canned soups. Canned pasta sauce. Casseroles. Pizza. Lasagna. Frozen meals.Potato chips. French fries. The items listed above may not be a complete list of foods and beverages you should limit. Contact a dietitian for more information. What foods should I avoid? Talk to your dietitian about specific foods you should avoid based on the typeof kidney stones you have and your overall health. Fruits Grapefruit. The  item listed above may not be a complete list of foods and beverages you should avoid. Contact a dietitian for more information. Summary Kidney stones are deposits of minerals and salts that form inside your kidneys. You can lower your risk of kidney stones by making changes to your diet. The most important thing you can do is drink enough fluid. Drink enough fluid to keep your urine pale yellow. Talk to your dietitian about how much calcium you should have each day, and eat less salt and animal protein as told by your dietitian. This information is not intended to replace advice given to you by your health care provider. Make sure you discuss any questions you have with your healthcare provider. Document Revised: 03/31/2019 Document Reviewed: 03/31/2019 Elsevier Patient Education  2022 [employer]  

## 2020-10-25 NOTE — Progress Notes (Signed)
10/25/20 3:18 PM   [name] [birth year] [id number]  CC: Left ureteral stone  HPI: I saw Mr. Billy Blevins in clinic for the above issue.  He is a 45 year old male with a history of 3-4 prior stone episodes all passed spontaneously who presented to the ER on 10/15/2020 with acute onset of severe left flank pain.  CT showed a 5 mm left distal ureteral stone with upstream hydronephrosis, small punctate left renal stones, and 2 ~66mm right renal stones with no right-sided hydronephrosis.  His pain completely resolved about a week ago.  He was not straining his urine, and did not see a stone.  He denies any other problems with urination or gross hematuria over the last week.  Urinalysis today is completely benign with no microscopic hematuria.   PMH: Past Medical History:  Diagnosis Date   Allergic rhinitis, seasonal    Chronic back pain    degenerative    GERD (gastroesophageal reflux disease)    History of head injury    TREE FALL ON HEAD   History of kidney stones    History of mononucleosis    2009   Hyperactive gag reflex    Hyperlipidemia    Hypertension    Irregular heart rhythm    PONV (postoperative nausea and vomiting)    after eye sx as child    Surgical History: Past Surgical History:  Procedure Laterality Date   ADENOIDECTOMY  as child   APPLICATION OF ROBOTIC ASSISTANCE FOR SPINAL PROCEDURE N/A 06/12/2016   Procedure: APPLICATION OF ROBOTIC ASSISTANCE FOR SPINAL PROCEDURE;  Surgeon: Kevan Ny Ditty, MD;  Location: [facility];  Service: Neurosurgery;  Laterality: N/A;   COLONOSCOPY WITH PROPOFOL N/A 08/02/2020   Procedure: COLONOSCOPY WITH PROPOFOL;  Surgeon: Lin Landsman, MD;  Location: [facility] ENDOSCOPY;  Service: Gastroenterology;  Laterality: N/A;   EYE SURGERY     lasix   LUMBAR LAMINECTOMY/DECOMPRESSION MICRODISCECTOMY  07/09/2011   Procedure: LUMBAR LAMINECTOMY/DECOMPRESSION MICRODISCECTOMY;  Surgeon: Melina Schools, MD;  Location: [facility];  Service:  Orthopedics;  Laterality: N/A;  LEFT L5-S1 DISCECTOMY   STRABISMUS SURGERY Left as child   TONSILLECTOMY  09-09-2010   VASECTOMY Bilateral 09/28/2015   Procedure: VASECTOMY;  Surgeon: Carolan Clines, MD;  Location: [facility];  Service: Urology;  Laterality: Bilateral;      Family History: Family History  Problem Relation Age of Onset   Hyperlipidemia Mother        diet controlled   Heart disease Father        CAD/ MI x 2   Diabetes Father    Parkinsonism Father    Anesthesia problems Neg Hx    Hypotension Neg Hx    Malignant hyperthermia Neg Hx    Pseudochol deficiency Neg Hx     Social History:  reports that he quit smoking about 14 years ago. His smoking use included cigarettes. He has never used smokeless tobacco. He reports current alcohol use of about 2.0 standard drinks of alcohol per week. He reports that he does not use drugs.  Physical Exam: BP 130/84   Pulse 73   Ht 5\' 10"  (1.778 m)   Wt 206 lb (93.4 kg)   BMI 29.56 kg/m    Constitutional:  Alert and oriented, No acute distress. Cardiovascular: No clubbing, cyanosis, or edema. Respiratory: Normal respiratory effort, no increased work of breathing. GI: Abdomen is soft, nontender, nondistended, no abdominal masses  Laboratory Data: Reviewed, see HPI  Pertinent Imaging: I have personally viewed  and interpreted the CT 10/15/2020, see HPI for interpretation.  Assessment & Plan:   45 year old male with history of kidney stones, most recently a spontaneously passed 5 mm left distal ureteral stone.  Asymptomatic today, and urinalysis benign.  We discussed general stone prevention strategies including adequate hydration with goal of producing 2.5 L of urine daily, increasing citric acid intake, increasing calcium intake during high oxalate meals, minimizing animal protein, and decreasing salt intake. Information about dietary recommendations given today.   He would like to follow-up with urology as  needed  Nickolas Madrid, MD 10/25/2020  [facility] [address] [place], [place] [address] [phone]

## 2020-10-26 LAB — URINALYSIS, COMPLETE
Bilirubin, UA: NEGATIVE
Glucose, UA: NEGATIVE
Ketones, UA: NEGATIVE
Leukocytes,UA: NEGATIVE
Nitrite, UA: NEGATIVE
Protein,UA: NEGATIVE
RBC, UA: NEGATIVE
Specific Gravity, UA: 1.02 (ref 1.005–1.030)
Urobilinogen, Ur: 0.2 mg/dL (ref 0.2–1.0)
pH, UA: 6 (ref 5.0–7.5)

## 2020-10-26 LAB — MICROSCOPIC EXAMINATION: Bacteria, UA: NONE SEEN
